# Patient Record
Sex: Female | Born: 1980 | Race: White | Hispanic: No | Marital: Single | Smoking: Current every day smoker
Health system: Southern US, Community
[De-identification: ages and names within clinical notes are randomized; demographics above are authoritative.]

## PROBLEM LIST (undated history)

## (undated) DIAGNOSIS — F1010 Alcohol abuse, uncomplicated: Secondary | ICD-10-CM

## (undated) DIAGNOSIS — Z789 Other specified health status: Secondary | ICD-10-CM

## (undated) DIAGNOSIS — I10 Essential (primary) hypertension: Secondary | ICD-10-CM

## (undated) HISTORY — PX: ANKLE SURGERY: SHX546

## (undated) HISTORY — DX: Alcohol abuse, uncomplicated: F10.10

---

## 2007-06-04 ENCOUNTER — Emergency Department: Admit: 2007-06-04 | Payer: Self-pay | Source: Emergency Department | Admitting: Emergency Medicine

## 2007-06-09 ENCOUNTER — Ambulatory Visit
Admit: 2007-06-09 | Disposition: A | Discharge status: Home / Self Care | Payer: Self-pay | Source: Ambulatory Visit | Admitting: Foot and Ankle Surgery

## 2007-06-09 LAB — HCG BLOOD QUANTITATIVE - AH CERNER: HCG: <0.1 m[IU]/mL (ref 0–9)

## 2007-06-11 ENCOUNTER — Ambulatory Visit
Admission: RE | Admit: 2007-06-11 | Disposition: A | Discharge status: Home / Self Care | Payer: Self-pay | Source: Ambulatory Visit | Admitting: Foot and Ankle Surgery

## 2010-03-19 ENCOUNTER — Ambulatory Visit: Payer: Self-pay

## 2010-03-24 LAB — LAB USE ONLY - HISTORICAL GYN CYTOLOGY/PAP SMEAR

## 2010-07-23 ENCOUNTER — Observation Stay
Admission: AD | Admit: 2010-07-23 | Disposition: A | Discharge status: Home / Self Care | Payer: Self-pay | Source: Ambulatory Visit | Admitting: Obstetrics & Gynecology

## 2010-07-23 LAB — COMPREHENSIVE METABOLIC PANEL
ALT: 13 U/L (ref 0–55)
AST (SGOT): 10 U/L (ref 5–34)
Albumin/Globulin Ratio: 0.9 (ref 0.9–2.2)
Albumin: 2.8 g/dL — ABNORMAL LOW (ref 3.5–5.0)
Alkaline Phosphatase: 86 U/L (ref 40–150)
Anion Gap: 10 (ref 5.0–15.0)
BUN: 4 mg/dL — ABNORMAL LOW (ref 7.0–19.0)
Bilirubin, Total: 0.2 mg/dL (ref 0.2–1.2)
CO2: 19 mEq/L — ABNORMAL LOW (ref 22–29)
Calcium: 8.8 mg/dL (ref 8.5–10.5)
Chloride: 106 mEq/L (ref 98–107)
Creatinine: 0.5 mg/dL — ABNORMAL LOW (ref 0.6–1.0)
Globulin: 3 g/dL (ref 2.0–3.6)
Glucose: 79 mg/dL (ref 70–100)
Potassium: 4 mEq/L (ref 3.5–5.1)
Protein, Total: 5.8 g/dL — ABNORMAL LOW (ref 6.0–8.3)
Sodium: 135 mEq/L — ABNORMAL LOW (ref 136–145)

## 2010-07-23 LAB — CBC AND DIFFERENTIAL
Baso(Absolute): 0.02 10*3/uL (ref 0.00–0.20)
Basophils: 0 % (ref 0–2)
Eosinophils Absolute: 0.12 10*3/uL (ref 0.00–0.70)
Eosinophils: 1 % (ref 0–5)
Hematocrit: 34 % — ABNORMAL LOW (ref 37.0–47.0)
Hgb: 11.2 g/dL — ABNORMAL LOW (ref 12.0–16.0)
Immature Granulocytes Absolute: 0.03 10*3/uL
Immature Granulocytes: 0 % (ref 0–1)
Lymphocytes Absolute: 2.18 10*3/uL (ref 0.50–4.40)
Lymphocytes: 19 % (ref 15–41)
MCH: 29.9 pg (ref 28.0–32.0)
MCHC: 32.9 g/dL (ref 32.0–36.0)
MCV: 90.9 fL (ref 80.0–100.0)
MPV: 10.5 fL (ref 9.4–12.3)
Monocytes Absolute: 0.56 10*3/uL (ref 0.00–1.20)
Monocytes: 5 % (ref 0–11)
Neutrophils Absolute: 8.75 10*3/uL
Neutrophils: 75 % (ref 52–75)
Platelets: 297 10*3/uL (ref 140–400)
RBC: 3.74 10*6/uL — ABNORMAL LOW (ref 4.20–5.40)
RDW: 14 % (ref 12–15)
WBC: 11.63 10*3/uL — ABNORMAL HIGH (ref 3.50–10.80)

## 2010-07-23 LAB — URINALYSIS, REFLEX TO MICROSCOPIC EXAM IF INDICATED
Bilirubin, UA: NEGATIVE
Blood, UA: NEGATIVE
Glucose, UA: NEGATIVE
Ketones UA: NEGATIVE
Nitrite, UA: NEGATIVE
Protein, UR: NEGATIVE
RBC, UA: <1 /HPF (ref 0–5)
Specific Gravity UA POCT: 1 (ref 1.001–1.035)
Squamous Epithelial Cells, Urine: <1 /HPF (ref 0–25)
Urine pH: 8 (ref 5.0–8.0)
Urobilinogen, UA: NORMAL mg/dL
WBC, UA: <1 /HPF (ref 0–5)

## 2010-07-23 LAB — PT AND APTT
PT INR: 0.9 (ref 0.9–1.1)
PT: 12 s — ABNORMAL LOW (ref 12.6–15.0)
PTT: 26 s (ref 23–37)

## 2010-07-23 LAB — FIBRINOGEN: Fibrinogen: 422 mg/dL (ref 189–458)

## 2010-07-23 LAB — GFR: EGFR: >60

## 2010-07-23 LAB — HEMOLYSIS INDEX: Hemolysis Index: 6 Index (ref 0–18)

## 2010-07-23 LAB — URIC ACID: Uric acid: 2.8 mg/dL (ref 2.6–6.0)

## 2010-07-23 LAB — FIBRIN SPLIT PRODUCTS: FDP: <10 ug/mL (ref <5)

## 2010-09-03 ENCOUNTER — Inpatient Hospital Stay
Admission: RE | Admit: 2010-09-03 | Disposition: A | Discharge status: Home / Self Care | Payer: Self-pay | Source: Ambulatory Visit | Admitting: Obstetrics

## 2010-09-03 LAB — CBC AND DIFFERENTIAL
Baso(Absolute): 0.03 10*3/uL (ref 0.00–0.20)
Basophils: 0 % (ref 0–2)
Eosinophils Absolute: 0.07 10*3/uL (ref 0.00–0.70)
Eosinophils: 0 % (ref 0–5)
Hematocrit: 35.8 % — ABNORMAL LOW (ref 37.0–47.0)
Hgb: 11.9 g/dL — ABNORMAL LOW (ref 12.0–16.0)
Immature Granulocytes Absolute: 0.08 10*3/uL — ABNORMAL HIGH
Immature Granulocytes: 1 % (ref 0–1)
Lymphocytes Absolute: 2.38 10*3/uL (ref 0.50–4.40)
Lymphocytes: 14 % — ABNORMAL LOW (ref 15–41)
MCH: 30.6 pg (ref 28.0–32.0)
MCHC: 33.2 g/dL (ref 32.0–36.0)
MCV: 92 fL (ref 80.0–100.0)
MPV: 11.4 fL (ref 9.4–12.3)
Monocytes Absolute: 0.99 10*3/uL (ref 0.00–1.20)
Monocytes: 6 % (ref 0–11)
Neutrophils Absolute: 13.04 10*3/uL
Neutrophils: 79 % — ABNORMAL HIGH (ref 52–75)
Platelets: 341 10*3/uL (ref 140–400)
RBC: 3.89 10*6/uL — ABNORMAL LOW (ref 4.20–5.40)
RDW: 14 % (ref 12–15)
WBC: 16.51 10*3/uL — ABNORMAL HIGH (ref 3.50–10.80)

## 2011-03-21 ENCOUNTER — Emergency Department
Admit: 2011-03-21 | Disposition: A | Discharge status: Home / Self Care | Payer: Self-pay | Source: Emergency Department | Admitting: Emergency Medicine

## 2011-10-06 NOTE — Op Note (Signed)
PATIENT:                       Linda Mclaughlin, Linda Mclaughlin      MEDICAL RECORD NUMBER:         [id number]      PATIENT LOCATION/SERVICE:       [id number]            DATE OF SURGERY:               06/11/2007      SURGEON:                       Gates Rigg, MD      ASSISTANT 1:            PREOPERATIVE DIAGNOSIS:  Right trimalleolar equivalent ankle fracture.            POSTOPERATIVE DIAGNOSIS:  Right trimalleolar equivalent ankle fracture.            PROCEDURE:  Open reduction internal fixation of the right trimalleolar      equivalent ankle fracture.            ANESTHESIA:  General.            COMPLICATIONS:  None.            INDICATIONS FOR PROCEDURE:  This patient is a pleasant 30 year old female      who suffered the above mentioned injury.  She had a deltoid ligament      rupture, nondisplaced posterior malleolar fracture and a displaced fibular      shaft fracture.  The risks and benefits of surgery were discussed at length      with the patient.  All of her questions were answered.  She wished to      proceed.            DESCRIPTION OF PROCEDURE:  The patient was brought back to the operating      room where general anesthesia was initiated.  Small bump was placed under      the right hip.  A well-padded thigh tourniquet was applied to the right      thigh.  The right lower extremity was prepped and draped in the usual      sterile fashion.  A longitudinal incision was made over the lateral aspect      of the distal fibula.  Blunt dissection was used to protect the branches of      the common peroneal nerve.  Limited subperiosteal dissection was performed.      There was a large posterior butterfly fragment.  Anatomic reduction was      obtained and an anterior-to-posterior lag screw was used to lag the      butterfly fragment.  Next, an eight-hole one-third tubular plate was placed      in typical fashion.  Combination of locking and nonlocking screws were      utilized.  Final FluoroScan imaging documented  anatomic reduction of the      tibiotalar joint.  The posterior malleolar fracture fell into position.      Due to its small size, fixation was not indicated.  Multiple stress views      and a cotton test did not reveal any evidence of talar instability.  For      this reason, a syndesmotic screw was not placed.  The wound was copiously      irrigated.  The deep tissues, subcutaneous tissues and skin were closed in      the typical fashion.  Bulky sterile dressings were applied.  Marcaine 0.5%      15 mL were infiltrated into the local soft tissues.  The patient was      awakened in the operating room and was transferred to the recovery room in      satisfactory condition.                                    Electronic Signing MD: Gates Rigg, MD                  [id number]      D:    06/11/2007      T:    06/12/2007      #:    [id number]      N:    [id number]            cc:   Gates Rigg, MD

## 2012-09-03 ENCOUNTER — Inpatient Hospital Stay: Payer: Self-pay | Admitting: Internal Medicine

## 2012-09-03 ENCOUNTER — Inpatient Hospital Stay
Admission: EM | Admit: 2012-09-03 | Discharge: 2012-09-06 | DRG: 086 | Disposition: A | Discharge status: Home / Self Care | Payer: Self-pay | Attending: Internal Medicine | Admitting: Internal Medicine

## 2012-09-03 DIAGNOSIS — F10931 Alcohol use, unspecified with withdrawal delirium: Secondary | ICD-10-CM | POA: Diagnosis not present

## 2012-09-03 DIAGNOSIS — S022XXA Fracture of nasal bones, initial encounter for closed fracture: Secondary | ICD-10-CM | POA: Diagnosis present

## 2012-09-03 DIAGNOSIS — S0990XA Unspecified injury of head, initial encounter: Secondary | ICD-10-CM | POA: Diagnosis present

## 2012-09-03 DIAGNOSIS — F10929 Alcohol use, unspecified with intoxication, unspecified: Secondary | ICD-10-CM | POA: Diagnosis present

## 2012-09-03 DIAGNOSIS — S066XAA Traumatic subarachnoid hemorrhage with loss of consciousness status unknown, initial encounter: Principal | ICD-10-CM | POA: Diagnosis present

## 2012-09-03 DIAGNOSIS — S066X9A Traumatic subarachnoid hemorrhage with loss of consciousness of unspecified duration, initial encounter: Secondary | ICD-10-CM

## 2012-09-03 DIAGNOSIS — F1020 Alcohol dependence, uncomplicated: Secondary | ICD-10-CM | POA: Diagnosis present

## 2012-09-03 DIAGNOSIS — H109 Unspecified conjunctivitis: Secondary | ICD-10-CM | POA: Diagnosis present

## 2012-09-03 DIAGNOSIS — F10229 Alcohol dependence with intoxication, unspecified: Secondary | ICD-10-CM | POA: Diagnosis present

## 2012-09-03 DIAGNOSIS — F919 Conduct disorder, unspecified: Secondary | ICD-10-CM | POA: Diagnosis present

## 2012-09-03 DIAGNOSIS — S02401A Maxillary fracture, unspecified, initial encounter for closed fracture: Secondary | ICD-10-CM | POA: Diagnosis present

## 2012-09-03 DIAGNOSIS — Z880 Allergy status to penicillin: Secondary | ICD-10-CM

## 2012-09-03 DIAGNOSIS — S0292XA Unspecified fracture of facial bones, initial encounter for closed fracture: Secondary | ICD-10-CM

## 2012-09-03 DIAGNOSIS — Y92009 Unspecified place in unspecified non-institutional (private) residence as the place of occurrence of the external cause: Secondary | ICD-10-CM

## 2012-09-03 DIAGNOSIS — K7010 Alcoholic hepatitis without ascites: Secondary | ICD-10-CM | POA: Diagnosis present

## 2012-09-03 DIAGNOSIS — S02400A Malar fracture unspecified, initial encounter for closed fracture: Secondary | ICD-10-CM | POA: Diagnosis present

## 2012-09-03 DIAGNOSIS — IMO0002 Reserved for concepts with insufficient information to code with codable children: Secondary | ICD-10-CM | POA: Diagnosis present

## 2012-09-03 DIAGNOSIS — D72829 Elevated white blood cell count, unspecified: Secondary | ICD-10-CM | POA: Diagnosis present

## 2012-09-03 DIAGNOSIS — S0230XA Fracture of orbital floor, unspecified side, initial encounter for closed fracture: Secondary | ICD-10-CM | POA: Diagnosis present

## 2012-09-03 DIAGNOSIS — F17200 Nicotine dependence, unspecified, uncomplicated: Secondary | ICD-10-CM | POA: Diagnosis present

## 2012-09-03 DIAGNOSIS — Z23 Encounter for immunization: Secondary | ICD-10-CM

## 2012-09-03 DIAGNOSIS — R030 Elevated blood-pressure reading, without diagnosis of hypertension: Secondary | ICD-10-CM | POA: Diagnosis present

## 2012-09-03 DIAGNOSIS — J329 Chronic sinusitis, unspecified: Secondary | ICD-10-CM | POA: Diagnosis present

## 2012-09-03 DIAGNOSIS — F10231 Alcohol dependence with withdrawal delirium: Secondary | ICD-10-CM | POA: Diagnosis not present

## 2012-09-03 DIAGNOSIS — Z818 Family history of other mental and behavioral disorders: Secondary | ICD-10-CM

## 2012-09-03 HISTORY — DX: Other specified health status: Z78.9

## 2012-09-03 MED ORDER — TETANUS-DIPHTHERIA TOXOIDS TD 5-2 LFU IM INJ
0.50 mL | INJECTION | Freq: Once | INTRAMUSCULAR | Status: AC
Start: 2012-09-04 — End: 2012-09-04
  Administered 2012-09-04: 0.5 mL via INTRAMUSCULAR
  Filled 2012-09-03: qty 0.5

## 2012-09-03 MED ORDER — HALOPERIDOL LACTATE 5 MG/ML IJ SOLN
5.00 mg | Freq: Once | INTRAMUSCULAR | Status: DC
Start: 2012-09-04 — End: 2012-09-04

## 2012-09-03 MED ORDER — LORAZEPAM 2 MG/ML IJ SOLN
2.00 mg | Freq: Once | INTRAMUSCULAR | Status: DC
Start: 2012-09-04 — End: 2012-09-04

## 2012-09-03 NOTE — ED Notes (Signed)
[employer] bedside.
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
## 2012-09-04 ENCOUNTER — Emergency Department: Payer: Self-pay

## 2012-09-04 DIAGNOSIS — S0990XA Unspecified injury of head, initial encounter: Secondary | ICD-10-CM

## 2012-09-04 DIAGNOSIS — F10929 Alcohol use, unspecified with intoxication, unspecified: Secondary | ICD-10-CM | POA: Diagnosis present

## 2012-09-04 DIAGNOSIS — S066X9A Traumatic subarachnoid hemorrhage with loss of consciousness of unspecified duration, initial encounter: Principal | ICD-10-CM

## 2012-09-04 LAB — CBC AND DIFFERENTIAL
Basophils Absolute Automated: 0.03 10*3/uL (ref 0.00–0.20)
Basophils Automated: 0 % (ref 0–2)
Eosinophils Absolute Automated: 0.1 10*3/uL (ref 0.00–0.70)
Eosinophils Automated: 1 % (ref 0–5)
Hematocrit: 46.9 % (ref 37.0–47.0)
Hgb: 15.8 g/dL (ref 12.0–16.0)
Immature Granulocytes Absolute: 0.04 10*3/uL
Immature Granulocytes: 0 % (ref 0–1)
Lymphocytes Absolute Automated: 1.87 10*3/uL (ref 0.50–4.40)
Lymphocytes Automated: 15 % (ref 15–41)
MCH: 34.6 pg — ABNORMAL HIGH (ref 28.0–32.0)
MCHC: 33.7 g/dL (ref 32.0–36.0)
MCV: 102.9 fL — ABNORMAL HIGH (ref 80.0–100.0)
MPV: 10.3 fL (ref 9.4–12.3)
Monocytes Absolute Automated: 0.52 10*3/uL (ref 0.00–1.20)
Monocytes: 4 % (ref 0–11)
Neutrophils Absolute: 9.61 10*3/uL — ABNORMAL HIGH (ref 1.80–8.10)
Neutrophils: 79 % — ABNORMAL HIGH (ref 52–75)
Nucleated RBC: 0 /100 WBC (ref 0–1)
Platelets: 264 10*3/uL (ref 140–400)
RBC: 4.56 10*6/uL (ref 4.20–5.40)
RDW: 13 % (ref 12–15)
WBC: 12.17 10*3/uL — ABNORMAL HIGH (ref 3.50–10.80)

## 2012-09-04 LAB — GFR: EGFR: >60

## 2012-09-04 LAB — BASIC METABOLIC PANEL
Anion Gap: 13 (ref 5.0–15.0)
BUN: 7 mg/dL (ref 7.0–19.0)
CO2: 22 mEq/L (ref 22–29)
Calcium: 9.6 mg/dL (ref 8.5–10.5)
Chloride: 107 mEq/L (ref 98–107)
Creatinine: 0.7 mg/dL (ref 0.6–1.0)
Glucose: 97 mg/dL (ref 70–100)
Potassium: 3.9 mEq/L (ref 3.5–5.1)
Sodium: 142 mEq/L (ref 136–145)

## 2012-09-04 LAB — HEMOLYSIS INDEX: Hemolysis Index: 12 Index (ref 0–18)

## 2012-09-04 LAB — ETHANOL: Alcohol: 359 mg/dL — ABNORMAL HIGH

## 2012-09-04 MED ORDER — HALOPERIDOL LACTATE 5 MG/ML IJ SOLN
INTRAMUSCULAR | Status: AC
Start: 2012-09-04 — End: 2012-09-04
  Administered 2012-09-04: 5 mg via INTRAMUSCULAR
  Filled 2012-09-04: qty 1

## 2012-09-04 MED ORDER — ACETAMINOPHEN 325 MG PO TABS
325.0000 mg | ORAL_TABLET | ORAL | Status: DC | PRN
Start: 2012-09-04 — End: 2012-09-06

## 2012-09-04 MED ORDER — LORAZEPAM 2 MG/ML IJ SOLN
1.00 mg | Freq: Three times a day (TID) | INTRAMUSCULAR | Status: DC | PRN
Start: 2012-09-04 — End: 2012-09-04

## 2012-09-04 MED ORDER — ACETAMINOPHEN 325 MG PO TABS
650.0000 mg | ORAL_TABLET | Freq: Four times a day (QID) | ORAL | Status: DC | PRN
Start: 2012-09-04 — End: 2012-09-06

## 2012-09-04 MED ORDER — KCL IN DEXTROSE-NACL 20-5-0.9 MEQ/L-%-% IV SOLN
INTRAVENOUS | Status: DC
Start: 2012-09-04 — End: 2012-09-06
  Filled 2012-09-04 (×2): qty 1000

## 2012-09-04 MED ORDER — PANTOPRAZOLE SODIUM 40 MG IV SOLR
40.00 mg | Freq: Every day | INTRAVENOUS | Status: DC
Start: 2012-09-04 — End: 2012-09-06
  Administered 2012-09-04 – 2012-09-06 (×3): 40 mg via INTRAVENOUS
  Filled 2012-09-04 (×3): qty 40

## 2012-09-04 MED ORDER — ONDANSETRON HCL 4 MG/2ML IJ SOLN
4.00 mg | INTRAMUSCULAR | Status: DC | PRN
Start: 2012-09-04 — End: 2012-09-06

## 2012-09-04 MED ORDER — SODIUM CHLORIDE 0.9 % IV BOLUS
1000.00 mL | Freq: Once | INTRAVENOUS | Status: AC
Start: 2012-09-04 — End: 2012-09-04
  Administered 2012-09-04: 1000 mL via INTRAVENOUS

## 2012-09-04 MED ORDER — LORAZEPAM 2 MG/ML IJ SOLN
1.00 mg | INTRAMUSCULAR | Status: DC | PRN
Start: 2012-09-04 — End: 2012-09-06

## 2012-09-04 MED ORDER — LORAZEPAM 2 MG/ML IJ SOLN
2.00 mg | Freq: Once | INTRAMUSCULAR | Status: AC
Start: 2012-09-04 — End: 2012-09-04

## 2012-09-04 MED ORDER — LORAZEPAM 2 MG/ML IJ SOLN
INTRAMUSCULAR | Status: AC
Start: 2012-09-04 — End: 2012-09-04
  Administered 2012-09-04: 2 mg via INTRAMUSCULAR
  Filled 2012-09-04: qty 1

## 2012-09-04 MED ORDER — HYDROMORPHONE HCL PF 1 MG/ML IJ SOLN
1.00 mg | INTRAMUSCULAR | Status: DC | PRN
Start: 2012-09-04 — End: 2012-09-06

## 2012-09-04 MED ORDER — HALOPERIDOL LACTATE 5 MG/ML IJ SOLN
5.00 mg | Freq: Once | INTRAMUSCULAR | Status: AC
Start: 2012-09-04 — End: 2012-09-04

## 2012-09-04 MED ORDER — LORAZEPAM 1 MG PO TABS
1.00 mg | ORAL_TABLET | ORAL | Status: DC | PRN
Start: 2012-09-04 — End: 2012-09-06

## 2012-09-04 MED ORDER — CLINDAMYCIN PHOSPHATE IN D5W 900 MG/50ML IV SOLN
900.00 mg | Freq: Three times a day (TID) | INTRAVENOUS | Status: DC
Start: 2012-09-04 — End: 2012-09-06
  Administered 2012-09-04 – 2012-09-06 (×6): 900 mg via INTRAVENOUS
  Filled 2012-09-04 (×9): qty 50

## 2012-09-04 MED ORDER — DEXTROSE-SODIUM CHLORIDE 5-0.9 % IV SOLN
Freq: Every day | INTRAVENOUS | Status: DC
Start: 2012-09-04 — End: 2012-09-06
  Filled 2012-09-04 (×4): qty 1000

## 2012-09-04 MED ORDER — DEXTROSE-SODIUM CHLORIDE 5-0.45 % IV SOLN
100.00 mL/h | INTRAVENOUS | Status: DC
Start: 2012-09-04 — End: 2012-09-04
  Administered 2012-09-04: 100 mL/h via INTRAVENOUS

## 2012-09-04 NOTE — ED Provider Notes (Signed)
The pt was seen by the midlevel provider.  I discussed the treatment and the plan of care with the midlevel provider.  I agree with the treatment plan and assessment.    I evaluated the pt.  Pt ambulating without difficulty.  Pt combative and argumentative.  Moving all extremities.    Jethro Bastos, MD  09/04/12 [phone]

## 2012-09-04 NOTE — ED Notes (Signed)
Patient is resting comfortably. 

## 2012-09-04 NOTE — ED Notes (Signed)
Pt. Was un handcuffed sitting in bed, needed to use restroom and pulled IV tubing out of arm, proceeded to walk to bathroom. Became very agitated and uncooperative, raising her voice and shouting expletives to ER staff and [employer]. Pt. Was brought back to room, handcuffed to bed and given sedative medication. Pt. Still remained agitated and shouting expletives to ER staff and [employer]. Pt. Now sleeping in bed with handcuffs on.

## 2012-09-04 NOTE — ED Notes (Signed)
inpatient admission order entered, clinicals completed, defer to unit cm/sw for ur and dcp needs.
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
## 2012-09-04 NOTE — ED Notes (Signed)
Received report form Edward Jolly, Charity fundraiser
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
## 2012-09-05 LAB — COMPREHENSIVE METABOLIC PANEL
ALT: 25 U/L (ref 0–55)
AST (SGOT): 31 U/L (ref 5–34)
Albumin/Globulin Ratio: 1.1 (ref 0.9–2.2)
Albumin: 3.4 g/dL — ABNORMAL LOW (ref 3.5–5.0)
Alkaline Phosphatase: 73 U/L (ref 40–150)
Anion Gap: 11 (ref 5.0–15.0)
BUN: 5 mg/dL — ABNORMAL LOW (ref 7.0–19.0)
Bilirubin, Total: 1.3 mg/dL — ABNORMAL HIGH (ref 0.2–1.2)
CO2: 22 mEq/L (ref 22–29)
Calcium: 9 mg/dL (ref 8.5–10.5)
Chloride: 102 mEq/L (ref 98–107)
Creatinine: 0.7 mg/dL (ref 0.6–1.0)
Globulin: 3.2 g/dL (ref 2.0–3.6)
Glucose: 82 mg/dL (ref 70–100)
Potassium: 3.8 mEq/L (ref 3.5–5.1)
Protein, Total: 6.6 g/dL (ref 6.0–8.3)
Sodium: 135 mEq/L — ABNORMAL LOW (ref 136–145)

## 2012-09-05 LAB — GFR: EGFR: >60

## 2012-09-05 LAB — CBC
Hematocrit: 41.8 % (ref 37.0–47.0)
Hgb: 13.8 g/dL (ref 12.0–16.0)
MCH: 34.3 pg — ABNORMAL HIGH (ref 28.0–32.0)
MCHC: 33 g/dL (ref 32.0–36.0)
MCV: 104 fL — ABNORMAL HIGH (ref 80.0–100.0)
MPV: 10.2 fL (ref 9.4–12.3)
Nucleated RBC: 0 /100 WBC (ref 0–1)
Platelets: 204 10*3/uL (ref 140–400)
RBC: 4.02 10*6/uL — ABNORMAL LOW (ref 4.20–5.40)
RDW: 13 % (ref 12–15)
WBC: 7.72 10*3/uL (ref 3.50–10.80)

## 2012-09-05 LAB — HEMOLYSIS INDEX: Hemolysis Index: 4 Index (ref 0–18)

## 2012-09-05 MED ORDER — SULFACETAMIDE SODIUM 10 % OP SOLN
2.00 [drp] | Freq: Four times a day (QID) | OPHTHALMIC | Status: DC
Start: 2012-09-05 — End: 2012-09-06
  Administered 2012-09-05 – 2012-09-06 (×4): 2 [drp] via OPHTHALMIC
  Filled 2012-09-05: qty 15

## 2012-09-05 MED ORDER — CLONIDINE HCL 0.1 MG PO TABS
0.10 mg | ORAL_TABLET | Freq: Four times a day (QID) | ORAL | Status: DC | PRN
Start: 2012-09-05 — End: 2012-09-06
  Administered 2012-09-05: 0.1 mg via ORAL
  Filled 2012-09-05: qty 1

## 2012-09-05 MED ORDER — AMLODIPINE BESYLATE 5 MG PO TABS
10.00 mg | ORAL_TABLET | Freq: Every day | ORAL | Status: DC
Start: 2012-09-05 — End: 2012-09-06
  Administered 2012-09-05 – 2012-09-06 (×2): 10 mg via ORAL
  Filled 2012-09-05 (×2): qty 2

## 2012-09-05 MED ORDER — CHLORDIAZEPOXIDE HCL 25 MG PO CAPS
25.00 mg | ORAL_CAPSULE | Freq: Four times a day (QID) | ORAL | Status: DC
Start: 2012-09-05 — End: 2012-09-06
  Administered 2012-09-05 – 2012-09-06 (×3): 25 mg via ORAL
  Filled 2012-09-05 (×5): qty 1

## 2012-09-05 MED ORDER — ATENOLOL 25 MG PO TABS
50.00 mg | ORAL_TABLET | Freq: Every day | ORAL | Status: DC
Start: 2012-09-05 — End: 2012-09-06
  Administered 2012-09-05 – 2012-09-06 (×2): 50 mg via ORAL
  Filled 2012-09-05 (×2): qty 2
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
## 2012-09-05 NOTE — Plan of Care (Signed)
PINC:    Pt is alert and oriented X 3. Plan of care was discussed with the pt: Discharge was delayed because BP was too high. Educated pt on BP meds. Possible [place] tomorrow. Pt verbalized understanding.

## 2012-09-05 NOTE — Progress Notes (Signed)
Pt remains alert and oriented x3. BP elevated all day. MD aware, clonidine prn given together with scheduled meds. Pt denies any pain or discomfort on this shift. Pt on IVF, IV abx, neuro checks and ciwa

## 2012-09-05 NOTE — Progress Notes (Signed)
On exam the patient has no pronator drift and is neurologically intact.  Head CT shows traumatic SAH.  No surgery indicated.  Agree with above note.    Paulette Blanch, MD, PhD
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
## 2012-09-05 NOTE — Progress Notes (Signed)
PROGRESS NOTE    Date Time: 09/05/2012 9:03 AM  Patient Name: Linda Mclaughlin, Linda Mclaughlin      Subjective:   Feels better this am  Mild tremors    Medications:     Current Facility-Administered Medications   Medication Status Dose Route Frequency   . chlordiazePOXIDE Active  25 mg Oral Q6H   . clindamycin Active  900 mg Intravenous Q8H SCH   . IV fluids with MVI, thiamine, folic acid Active   Intravenous Daily   . pantoprazole Active  40 mg Intravenous Daily   . sulfacetamide Active  2 drop Both Eyes Q6H   . tetanus & diphtheria toxoids (adult) Completed  0.5 mL Intramuscular Once          . dextrose 5 % and 0.9 % NaCl with KCl 20 mEq Active 100 mL/hr at 09/04/12 2358   . DISCONTD: dextrose 5 % and 0.45% NaCl Discontinued 100 mL/hr (09/04/12 0631)     acetaminophen Active, acetaminophen Active, cloNIDine Active, HYDROmorphone Active, LORazepam Active, LORazepam Active, ondansetron Active, DISCONTD: LORazepam Discontinued    Review of Systems:   Respiratory ROS: no cough, shortness of breath, or wheezing  Cardiovascular ROS: no chest pain or dyspnea on exertion  No  Blurred vision currently.  Mild headaches.    Physical Exam:     Filed Vitals:    09/05/12 0744   BP: 165/97   Pulse: 60   Temp: 97.2 F (36.2 C)   Resp: 17   SpO2: 99%       Intake and Output Summary (Last 24 hours) at Date Time  No intake or output data in the 24 hours ending 09/05/12 0903    General: awake, alert, oriented x 3; no acute distress.  Heent: left  Periorbital  Ecchymosis  And dried blood  Around  Nostrils.pupils equal and reactive . conjunctivitis  Cardiovascular: regular rate and rhythm, no murmurs, rubs or gallops  Lungs: clear to auscultation bilaterally, without wheezing, rhonchi, or rales  Abdomen: soft, non-tender, non-distended; no palpable masses, no hepatosplenomegaly, normoactive bowel sounds, no rebound or guarding  Extremities: no clubbing, cyanosis, or edema  Areas of  Abrasions on left  Arm and forearm.  CNS  Non focal exam   Mild  tremors  Noted.      Labs:     Results     Procedure Component Value Units Date/Time    CBC without differential [[id number]]  (Abnormal) Collected:09/05/12 0408    Specimen Information:Blood Updated:09/05/12 0511     WBC 7.72 x10 3/uL      RBC 4.02 (L) x10 6/uL      Hgb 13.8 g/dL      Hematocrit 16.1 %      MCV 104.0 (H) fL      MCH 34.3 (H) pg      MCHC 33.0 g/dL      RDW 13 %      Platelets 204 x10 3/uL      MPV 10.2 fL      Nucleated RBC 0 /100 WBC     Comprehensive metabolic panel [[id number]]  (Abnormal) Collected:09/05/12 0408    Specimen Information:Blood Updated:09/05/12 0455     Glucose 82 mg/dL      BUN 5.0 (L) mg/dL      Creatinine 0.7 mg/dL      Sodium 096 (L) mEq/L      Potassium 3.8 mEq/L      Chloride 102 mEq/L      CO2 22 mEq/L  Calcium 9.0 mg/dL      Protein, Total 6.6 g/dL      Albumin 3.4 (L) g/dL      AST (SGOT) 31 U/L      ALT 25 U/L      Alkaline Phosphatase 73 U/L      Bilirubin, Total 1.3 (H) mg/dL      Globulin 3.2 g/dL      Albumin/Globulin Ratio 1.1      Anion Gap 11.0     HEMOLYZED INDEX [[id number]] Collected:09/05/12 0408     Hemolyzed Index 4 Index Updated:09/05/12 0455    GFR [[id number]] Collected:09/05/12 0408     EGFR >60.0   Updated:09/05/12 0455          Rads:     Radiology Results (24 Hour)     ** No Results found for the last 24 hours. **          Assessment:     Patient Active Problem List   Diagnosis   . Alcohol intoxication   . Head injury    leukocytosis  Secondary  To trauma/cellulitis.  Traumatic   SAH.   Facial  Bone  Fractures.  Conjuntivitis.  Elevated BP secondary to  Alcohol withdrawals?   Alcoholic hepatitis.    Plan:   Empiric  IV antibiotics  IVF with thiamine  Topical  Antibiotics for  Conjunctivitis.  Add  Librium  For  DT prophylaxis  Analgesics.  Prn clonidine to counteract  Sympathetic discharge.  CIWA  Protocol to  Prevent  Delirium tremens.  Plan  Discussed  with  RN  Continue rest of treatment.  Possible discharge in am if blood pressure improved.  Signed  by: Gwyndolyn Kaufman, MD

## 2012-09-05 NOTE — Plan of Care (Signed)
Pt slept well,denies any pain,neuro checks wnl.IV fluids infusing,am labs done.Plan: Continue monitor vital signs,neuro checks and CIWA.

## 2012-09-06 LAB — CBC
Hematocrit: 39.3 % (ref 37.0–47.0)
Hgb: 13.2 g/dL (ref 12.0–16.0)
MCH: 34.5 pg — ABNORMAL HIGH (ref 28.0–32.0)
MCHC: 33.6 g/dL (ref 32.0–36.0)
MCV: 102.6 fL — ABNORMAL HIGH (ref 80.0–100.0)
MPV: 10.6 fL (ref 9.4–12.3)
Nucleated RBC: 0 /100 WBC (ref 0–1)
Platelets: 175 10*3/uL (ref 140–400)
RBC: 3.83 10*6/uL — ABNORMAL LOW (ref 4.20–5.40)
RDW: 13 % (ref 12–15)
WBC: 7.23 10*3/uL (ref 3.50–10.80)

## 2012-09-06 LAB — COMPREHENSIVE METABOLIC PANEL
ALT: 21 U/L (ref 0–55)
AST (SGOT): 27 U/L (ref 5–34)
Albumin/Globulin Ratio: 1 (ref 0.9–2.2)
Albumin: 3.2 g/dL — ABNORMAL LOW (ref 3.5–5.0)
Alkaline Phosphatase: 74 U/L (ref 40–150)
Anion Gap: 9 (ref 5.0–15.0)
BUN: 6 mg/dL — ABNORMAL LOW (ref 7.0–19.0)
Bilirubin, Total: 0.8 mg/dL (ref 0.2–1.2)
CO2: 23 mEq/L (ref 22–29)
Calcium: 9.3 mg/dL (ref 8.5–10.5)
Chloride: 103 mEq/L (ref 98–107)
Creatinine: 0.7 mg/dL (ref 0.6–1.0)
Globulin: 3.1 g/dL (ref 2.0–3.6)
Glucose: 100 mg/dL (ref 70–100)
Potassium: 3.7 mEq/L (ref 3.5–5.1)
Protein, Total: 6.3 g/dL (ref 6.0–8.3)
Sodium: 135 mEq/L — ABNORMAL LOW (ref 136–145)

## 2012-09-06 LAB — HEMOLYSIS INDEX: Hemolysis Index: 6 Index (ref 0–18)

## 2012-09-06 LAB — GFR: EGFR: >60

## 2012-09-06 MED ORDER — CHLORDIAZEPOXIDE HCL 10 MG PO CAPS
10.00 mg | ORAL_CAPSULE | Freq: Four times a day (QID) | ORAL | Status: DC
Start: 2012-09-06 — End: 2012-09-06

## 2012-09-06 MED ORDER — SULFACETAMIDE SODIUM 10 % OP SOLN
2.00 [drp] | Freq: Four times a day (QID) | OPHTHALMIC | Status: AC
Start: 2012-09-06 — End: 2012-09-16

## 2012-09-06 MED ORDER — CLINDAMYCIN HCL 300 MG PO CAPS
300.00 mg | ORAL_CAPSULE | Freq: Three times a day (TID) | ORAL | Status: AC
Start: 2012-09-06 — End: 2012-09-16

## 2012-09-06 MED ORDER — ATENOLOL 50 MG PO TABS
50.00 mg | ORAL_TABLET | Freq: Every day | ORAL | Status: AC
Start: 2012-09-06 — End: 2013-09-06

## 2012-09-06 MED ORDER — CHLORDIAZEPOXIDE HCL 10 MG PO CAPS
10.00 mg | ORAL_CAPSULE | Freq: Three times a day (TID) | ORAL | Status: AC | PRN
Start: 2012-09-06 — End: 2012-09-18

## 2012-09-06 MED ORDER — ACETAMINOPHEN 325 MG PO TABS
650.00 mg | ORAL_TABLET | Freq: Four times a day (QID) | ORAL | Status: AC | PRN
Start: 2012-09-06 — End: 2012-09-16

## 2012-09-06 NOTE — Plan of Care (Signed)
Pt to be discharged home. Alert and oriented  X3. Vital signs stable.  Pt ambulated in the hallway, blood pressure remained stable.
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
## 2012-09-06 NOTE — Discharge Instructions (Addendum)
Alcohol Abuse  Alcoholic drinks are harmful when you have too many of them. Drinking that disrupts your life is called alcohol abuse. Alcohol abuse can hurt your relationships with others. You may lose friends, a spouse, or even your job. You may be abusing alcohol if any of the following are true for you:   Duties at home or with child care suffer because of drinking.   Duties at work or in school suffer because of drinking.   You have missed work or school because of drinking.   You use alcohol while driving or operating machinery.   You have legal problems such as arrests due to drinking.   You keep drinking even though it causes serious problems in your life.  Alcohol abuse can cause health problems. Too much alcohol can cause disease of the liver and pancreas. It can damage your brain and your heart. It raises your risk of cancer. It can lead to sexual problems and make it hard to conceive children. Alcohol abuse in pregnancy can hurt the baby. Alcohol affects how you think and respond. You are more likely to die in an accident or fire if you have been drinking.  Home Care   Admit you have a problem with alcohol.   Ask for help from your healthcare provider as well as trusted family members or close friends.   Get help from people trained in dealing with alcohol abuse. This may be individual counseling or group therapy, or it may be a supervised alcohol treatment program.   Join a self-help group for alcohol abuse such as Alcoholics Anonymous (AA).   Avoid people who abuse alcohol or tempt you to drink.  Follow Up  as advised by the doctor or our staff. Contact these groups to get help:   Alcoholics Anonymous (AA): Go to SalaryStart.tn or check the phone book for meetings near you.   National Alcohol and Substance Abuse Information Center [facility]): (782)291-1677 www.addictioncareoptions.com   [employer] on Alcoholism and Drug Dependence (NCADD): 800-NCA-CALL (602)007-8034) www.ncadd.org   Al-Anon:  888-4AL-ANON (478-2956) www.al-anon.org  Get Prompt Medical Attention  if you have:   Confusion   Hallucinations (seeing, hearing, or feeling things that aren't there)   Extreme drowsiness or inability to awaken   Increasing upper abdominal pain   Repeated vomiting, vomiting blood, or black or tarry stools   Severe shakiness or fast heart rate   Seizure (convulsion)   [address] [place], [place] [address]. All rights reserved. This information is not intended as a substitute for professional medical care. Always follow your healthcare professional's instructions.      Alcohol Overdose    You have overdosed on alcohol. This means you drank a large amount of alcohol in a short period of time.An alcohol overdose is a serious condition. It can cause coma and even death! It puts you at risk for vomiting and "aspiration" (when the vomit is breathed into your lungs!). Aspiration causes a severe pneumonia that can be fatal.  An alcohol overdose can be a result of not understanding the powerful effects of alcohol. But it can also be a sign of depression or excessive anger at yourself or another person. If you feel that there are emotional reasons for this overdose, we advise that you have an evaluation by a psychiatrist, counselor or therapist.  Home Care:   Do not drink any more alcohol.   DO NOT DRIVE until all effects of the alcohol have worn off.  Get lots of rest over the next few days and drink lots of water and other non-alcoholic liquids. Try to eat regular meals.   If you have been drinking heavily on a daily basis, you may go through ALCOHOL WITHDRAWAL, also called the "shakes" or "DTs." The usual symptoms last 3-4 days and may include nausea, sweating, sleeplessness, nervousness, shakiness or seizure. During this time, it is best that you stay with family or friends who can help and support you. You can also admit yourself to a residential detox program.   Heavy  regular drinking combined with poor nutrition can lead to a thiamine deficiency and cause permanent brain damage.If you are unable to stop drinking, it is important that you take daily vitamins.  Follow Up:  If alcohol is causing a problem in your life, the following organizations are available to help:   Alcoholics Anonymous offers support through a self-help fellowship.There are no dues or fees. See the Yellow Pages and call for time and place of meetings. SalaryStart.tn   Al-Anon offers support to families of alcohol users.800-356-9996www.al-anon.[employer] on Alcoholism and Drug Dependencewww.ncadd.567-667-2861   Residential alcohol detox programs are available. Check the Yellow Pages under Drug Abuse & Treatment Centers.  [NOTE: If you had an X-ray or EKG (cardiogram), it will be reviewed by a specialist. You will be notified of any new findings that may affect your care.]  Return Promptly  or contact your doctor if any of the following occur:   Severe shakiness or seizure (convulsion)   Fever over 100.4 F (38C)   Confusion or hallucinations (seeing, hearing or feeling things that aren't there)   Increasing upper abdominal pain   Repeated vomiting or vomiting blood   [address], [place], [place] [address]. All rights reserved. This information is not intended as a substitute for professional medical care. Always follow your healthcare professional's instructions.      Alcohol Intoxication  Alcohol intoxication occurs when you drink alcohol faster than your liver can remove it from your system. Alcohol intoxication affects your judgment and coordination. Very high blood alcohol levels can cause coma, very slow breathing and even death.  If you drink alcohol every day, this may gradually cause permanent damage to your liver, brain, heart, pancreas and other organs. Alcohol use during pregnancy may cause permanent damage to the growing baby.  Home  Care:   Do not drink any more alcohol.   DO NOT DRIVE until all effects of the alcohol have worn off.   Get lots of rest over the next few days. Drink plenty of water and other non-alcoholic liquids. Try to eat regular meals.   If you have been drinking heavily on a daily basis, you may go through alcohol withdrawl. This is also called the "shakes" or "DTs." The usual symptoms last 3 to 4 days and may include nervousness, shakiness, nausea, sweating or sleeplessness. During this time, it is best that you stay with family or friends who can help and support you. You can also admit yourself to a residential detox program. If your symptoms are severe, contact your doctor for medicines to help.  Follow Up:  If alcohol is causing a problem in your life, these and other organizations can help you:  Alcoholics Anonymous offers support through a self-help fellowship. There are no dues or fees. See the Yellow Pages and call for time and place of meetings. SalaryStart.tn  Al-Anon offers support to families of alcohol users.  515-094-5277 www.al-anon.[employer] On Alcoholism And Drug Dependence 2027155753 www.ncadd.org  There are also inpatient or residential alcohol detox programs. Check the Internet or phonebook Yellow Pages under "Drug Abuse & Treatment Centers."  Get Prompt Medical Attention  if any of the following occur:   Severe shakiness or seizure (convulsion)   Fever over 100.4 F (38.0 C)   Confusion or hallucinations (seeing, hearing or feeling things that are not there)   Increasing upper abdominal pain   Repeated vomiting or vomiting blood   [address] [place], [place] [address]. All rights reserved. This information is not intended as a substitute for professional medical care. Always follow your healthcare professional's instructions.      Facial Fracture  A facial fracture means you have one or more broken bones in your face. These may be in your jaw,  nose, cheeks, or the sockets around your eyes. Car accidents are the most common cause of facial fractures. Fights, falls, and sports injuries can also injure facial bones.    When to Go to the Emergency Room (ER)  A broken bone in your face is cause for concern. That's because your airways may become clogged with bone fragments, blood clots, or swollen tissue. Go to the ER or call 911 right away if you have any of these symptoms:   Pain and swelling in your face   Trouble swallowing or breathing   An upper and lower jaw that don't meet properly, or pain when you move your jaw   A displaced jaw or nose   An open wound where you can see the bone   Blood or clear fluid from your nose   Blurred vision, double vision, or problems moving your eyeball  What to Expect in the ER  You will likely be given medication for pain. A doctor will ask about your injury and examine your head and face. X-rays or other imaging tests may be done. Treatment of facial fractures occurs in two stages:   Reduction: The broken bones are put back into place. This is often done after the swelling subsides, but severe fractures may be repaired right away.   Fixation: The broken bones are held together so they heal correctly. A fractured jaw is likely to be wired shut for a time. A broken nose is treated with a splint or soft packing. Surgery may be done to repair and secure broken bones around your eyes.   [address] [place], [place] [address]. All rights reserved. This information is not intended as a substitute for professional medical care. Always follow your healthcare professional's instructions.          Stages of Addiction: Social Use  Not everyone who takes a drink or tries a drug has a substance abuse problem. But for some people, what starts as social use can lead to problem use (abuse) and then addiction. Some people can have a drink or use another substance and then stop. They don't think  about drinking or using again for a while. For some this will never lead to heavier use. For others, it may.    You Can Take It or Leave It   You have a drink at a party or a glass of wine with dinner.   You tried a street drug once or twice, but never used again.   You take medications only when needed and only as directed.   You don't think about  using or plan the next time you'll use.  Using is Still a Choice  A social user can choose to say "yes" or "no" at any time. This person is in control of his or her substance use. Think about these statements:   I can put down a drink without finishing it.   I can enjoy myself at a social event without using.   I can go without using or thinking about using.  If this describes you, you're most likely a social user. But ask yourself, can you really take it or leave it? You might try for a few weeks or even a month. If you find it hard or you just can't do it, maybe you need to think about your use again.   [address], [place], [place] [address]. All rights reserved. This information is not intended as a substitute for professional medical care. Always follow your healthcare professional's instructions.       Patient Education   Atenolol Oral tablet    Atenolol Solution for injection   Atenolol Oral tablet    What is this medicine?  ATENOLOL (a TEN oh lole) is a beta-blocker. Beta-blockers reduce the workload on the heart and help it to beat more regularly. This medicine is used to treat high blood pressure and to prevent chest pain. It is also used to protect the heart during a heart attack and to prevent an additional heart attack from occurring.  This medicine may be used for other purposes; ask your health care provider or pharmacist if you have questions.    What should I tell my health care provider before I take this medicine?  They need to know if you have any of these conditions:   diabetes   heart or vessel disease like  slow heart rate, worsening heart failure, heart block, sick sinus syndrome or Raynaud's disease   kidney disease   lung or breathing disease, like asthma or emphysema   pheochromocytoma   thyroid disease   an unusual or allergic reaction to atenolol, other beta-blockers, medicines, foods, dyes, or preservatives   pregnant or trying to get pregnant   breast-feeding     How should I use this medicine?  Take this medicine by mouth with a drink of water. Follow the directions on the prescription label. This medicine may be taken with or without food. Take your medicine at regular intervals. Do not take more medicine than directed. Do not stop taking this medicine suddenly. This could lead to serious heart-related effects.  Talk to your pediatrician regarding the use of this medicine in children. Special care may be needed.  Overdosage: If you think you have taken too much of this medicine contact a poison control center or emergency room at once.  NOTE: This medicine is only for you. Do not share this medicine with others.    What if I miss a dose?  If you miss a dose, take it as soon as you can. If it is almost time for your next dose, take only that dose. Do not take double or extra doses.    What may interact with this medicine?  Do not take this medicine with any of the following medications:   sotalol  This medicine may also interact with the following medications:   clonidine   digoxin   diuretics   dobutamine   epinephrine   isoproterenol   medicine for blood pressure like amlodipine, diltiazem, verapamil  NSAIDs, medicines for pain and inflammation, like ibuprofen or naproxen   reserpine  This list may not describe all possible interactions. Give your health care provider a list of all the medicines, herbs, non-prescription drugs, or dietary supplements you use. Also tell them if you smoke, drink alcohol, or use illegal drugs. Some items may interact with your medicine.    What should I watch  for while using this medicine?  Visit your doctor or health care professional for regular check ups. Check your blood pressure and pulse rate regularly. Ask your health care professional what your blood pressure and pulse rate should be, and when you should contact him or her.  You may get drowsy or dizzy. Do not drive, use machinery, or do anything that needs mental alertness until you know how this medicine affects you. Do not stand or sit up quickly. Alcohol may interfere with the effect of this medicine. Avoid alcoholic drinks.  This medicine can affect blood sugar levels. If you have diabetes, check with your doctor or health care professional before you change your diet or the dose of your diabetic medicine.  Do not treat yourself for coughs, colds, or pain while you are taking this medicine without asking your doctor or health care professional for advice. Some ingredients may increase your blood pressure.    What side effects may I notice from receiving this medicine?  Side effects that you should report to your doctor or health care professional as soon as possible:   allergic reactions like skin rash, itching or hives, swelling of the face, lips, or tongue   breathing problems   changes in vision   chest pain   cold, tingling, or numb hands or feet   depression   fast, irregular heartbeat   feeling faint or lightheaded, falls   fever with sore throat   rapid weight gain   swollen ankles, legs  Side effects that usually do not require medical attention (report to your doctor or health care professional if they continue or are bothersome):   anxiety, nervous   diarrhea   dry skin   change in sex drive or performance   headache   nightmares or trouble sleeping   short term memory loss   stomach upset   unusually tired  This list may not describe all possible side effects. Call your doctor for medical advice about side effects. You may report side effects to FDA at 1-800-FDA-1088.    Where  should I keep my medicine?  Keep out of the reach of children.  Store at room temperature between 20 and 25 degrees C (68 and 77 degrees F). Close tightly and protect from light. Throw away any unused medicine after the expiration date.  NOTE:This sheet is a summary. It may not cover all possible information. If you have questions about this medicine, talk to your doctor, pharmacist, or health care provider. Copyright 2013 [employer]    Hypertension    You have been diagnosed with elevated blood pressure.    The medical term for high blood pressure is hypertension. Many people feel anxious or uncomfortable about being at the hospital. If you feel anxious today, this could make your blood pressure appear high, even if your blood pressure is usually normal. Check your blood pressure several more times when you are not feeling stress. Keep a record of these readings and give this information to your regular doctor. He or she will decide whether you have hypertension that requires  medical treatment.    If your blood pressure becomes extremely high all of a sudden, you will probably notice symptoms. In fact, very high blood pressure is a medical emergency. Most people with hypertension have blood pressure that is only a little too high. Mild high blood pressure does not cause specific symptoms. Instead, the effects of hypertension develop slowly over time. Untreated hypertension can affect the heart, brain, kidneys, eyes, and blood vessels. Unfortunately, by the time side-effects become noticeable, the body has already been damaged. This is why hypertension is called "the silent killer!"    The physician treating you today decided to start you on a blood pressure medicine. It is VERY IMPORTANT that you follow up with your regular doctor so he or she can follow your progress and adjust the blood pressure medications.    It is important to follow up with your regular doctor. Check your blood pressure several times  in the next 1 to 2 weeks and tell your doctor about the results. It may be helpful to keep a log or a journal where you can write down your blood pressures. Note the time of day and the activity you were doing when the reading was taken.    YOU SHOULD SEEK MEDICAL ATTENTION IMMEDIATELY, EITHER HERE OR AT THE NEAREST EMERGENCY DEPARTMENT, IF ANY OF THE FOLLOWING OCCURS:   You have a headache.   You have chest pain.   You are short of breath or have trouble breathing.    You feel weak, especially on only one side of the body.   Your symptoms get worse or you have other concerns.        MEDICATION: CHLORDIAZEPOXIDE  Chlordiazepoxide (brand: Librium) is used for anxiety, sleeplessness, and symptoms of alcohol withdrawal.    DIRECTIONS FOR USE:  Chlordiazepoxide may be taken with or without meals. Take the medicine at regular intervals as prescribed. If the label says "as needed," this means take it only when you are having symptoms. Do not take it more often than prescribed.    WHAT TO WATCH FOR:  POSSIBLE SIDE EFFECTS: Drowsiness, tiredness, confusion, dizziness, difficulty walking, constipation, restlessness, trembling, blurred vision, headache (Contact your doctor if these symptoms are severe or persist more than a few days). Dry mouth (Suck on a hard candy or chew gum). Slurred speech, tremor, muscle twitches, uncontrollable muscle movements, mood changes, yellowing eyes or skin, persistent nausea/vomiting, dark urine, difficulty passing urine (Stop the medicine and contact your doctor).  ALLERGIC REACTION: Itching, rash, swelling, trouble breathing or swallowing (Contact your doctor or return to this facility promptly).  MEDICAL CONDITIONS: Before starting this medicine, be sure your doctor knows if you have any of the following conditions:   Pregnancy, breastfeeding, kidney or liver disease, lung disease, certain blood disorders  DRUG INTERACTIONS: Before starting this medicine, be sure your doctor knows if  you are taking any of the following drugs:   Sedatives, tranquilizers, phenobarbital, pain medicine, antidepressants, antihistamines, seizure medicines, theophylline products   Cimetidine (Tagamet), digoxin (Lanoxin), Antabuse, levodopa, fluconazole/ketoconazole, calcium channel blockers, conivaptan, birth control pills, erythromycin/clarithromycin  WARNINGS:   Do not drive, ride a bicycle, or operate dangerous equipment while taking this medicine until you know how it will affect you.   Avoid the use of alcohol and other sedatives when taking this drug. Combining these may cause oversedation or unconsciousness.   Prolonged use of this drug may lead to physical and psychological drug dependence. Do not take this drug continuously for an  extended period of time without consulting your doctor.  [NOTE: This information topic may not include all directions, precautions, medical conditions, drug/food interactions, and warnings for this drug. Check with your doctor, nurse, or pharmacist for any questions that you may have.]   [address], [address] [place], [place] [address]. All rights reserved. This information is not intended as a substitute for professional medical care. Always follow your healthcare professional's instructions.    MEDICATION: CLINDAMYCIN  Clindamycin (brand name: Cleocin) is a type of antibiotic used to treat infections.    DIRECTIONS FOR USE:   Take this medicine with a full glass of water, with or without food. If you find that it upsets your stomach, take it with food.   Take the medicine at regular intervals. If the label says "EVERY 6 HOURS", this means 4 times per day.   If you miss a dose, take it as soon as possible. However, if it is almost time for your next dose, skip the missed dose. Do not take two doses at the same time to make up for a missed dose.   To prevent a recurrence of the infection, finish all of the doses that you are given to complete  treatment.   Do not refrigerate the liquid form of this medicine.  Date Time: 09/06/2012 12:06 PM  Attending Physician: Valente David, MD    Date of Admission:   09/03/2012    Reason for Admission:   Alcohol intoxication [305.00]  Head injury [959.01]  Traumatic subarachnoid hemorrhage [852.00]  Multiple facial bone fractures [804.00]    Follow up:        Neurology (If Stroke): ______________________  Phone:______________________    Other:___________________________________   Phone: ______________________       Medications:  Your medications have been listed for you on the Medication Reconciliation Discharge Home List. Please bring a copy of all discharge instructions, including your Medication Reconciliation Discharge Home List when you visit your physician.    Continue taking all medications even if you feel well, unless otherwise instructed by physician.  Do not take any over-the-counter medications or herbal supplements without checking with your pharmacist or doctor.     Activity:  Rise slowly from a sitting or lying position. Increase activity slowly, unless otherwise instructed by physician.  Perform exercises as desginated by Therapist and Physician.  In the event of severe shortness of breath or chest discomfort, call 911. Do NOT drive to the hospital.  Speak with your physician regarding specific driving and/or work restrictions.    Diet:  If you have special diet orders, you have been given printed diet instructions.   ________________________________________________________________________    Tobacco Cessation Counseling:  If you are currently a tobacco user or have used tobacco within the last 12 months, we have provided you with written Tobacco Cessation Counseling.  ________________________________________________________________________    Heart Failure Education:  If you have a diagnosis of a Heart Failure, we have provided you with written Heart Failure Education.   Weigh yourself once a day  at the same time. Record and bring the weight record to your next physician appointment.   Call your doctor if you gain more than 3 pounds in one day or 5 pounds in one week, or if you experience shortness of breath, leg swelling and/or chest discomfort.   Enroll in the [facility] Tel-Assurance Program, a heart failure patient support program. Call 610-702-2021 for additional details.   ________________________________________________________________________    [facility] Education:  Call  911 for:  Sudden numbness or weakness of the face  Sudden numbness of the arm or leg especially one side of the body  Sudden confusion, trouble speaking or understanding  Sudden trouble seeing in one or both eyes  Sudden trouble walking or dizziness, loss of balance or coordination      For promotion of your health, we have provided you with personalized written education on risk factors specific to your diagnosis, including but not limited to:  High Blood Pressure  High Cholesterol  Atrial Fibrillation  Overweight  Diabetes  Smoking         Vaccinations  Pneumonia Vaccine Received on:  Flu Vaccine Received on:             Treatments/Special Instructions:   ***               Signed by: Rexene Alberts, RN    I HAVE RECEIVED AND UNDERSTAND THESE DISCHARGE INSTRUCTIONS.              Patient Education  Sulfacetamide Sodium Ophthalmic drops, solution   Sulfacetamide Sodium Ophthalmic ointment   Sulfacetamide Sodium Topical cream   Sulfacetamide Sodium Topical emulsion   Sulfacetamide Sodium Topical foam   Sulfacetamide Sodium Topical gel   Sulfacetamide Sodium Topical lotion   Sulfacetamide Sodium Topical pad, cleanser   Sulfacetamide Sodium Topical suspension   Sulfacetamide Sodium Topical suspension, cleanser   Sulfacetamide Sodium Ophthalmic drops, solution  What is this medicine?  SULFACETAMIDE (sul fa SEE ta mide) is a sulfonamide antibiotic. It is used to treat eye infections.  This medicine may be used for other purposes; ask your  health care provider or pharmacist if you have questions.  What should I tell my health care provider before I take this medicine?  They need to know if you have any of these conditions:  eye injury or eye surgery  an unusual or allergic reaction to sulfacetamide, sulfa drugs, other medicines, foods, dyes, or preservatives  pregnant or trying to get pregnant  breast-feeding  How should I use this medicine?  This medicine is only for use in the eye. Do not take by mouth. Follow the directions on the prescription label. Wash hands before and after use. Tilt your head back slightly and pull your lower eyelid down with your index finger to form a pouch. Try not to touch the tip of the dropper to your eye, fingertips, or any other surface. Squeeze the prescribed number of drops into the pouch. Close the eye gently to spread the drops. Your vision may blur for a few minutes. Use your doses at regular intervals. Do not use your medicine more often than directed. Finish the full course prescribed by your doctor or health care professional even if you think your condition is better.  Talk to your pediatrician regarding the use of this medicine in children. Special care may be needed.  Overdosage: If you think you have taken too much of this medicine contact a poison control center or emergency room at once.  NOTE: This medicine is only for you. Do not share this medicine with others.  What if I miss a dose?  If you miss a dose, use it as soon as you can. If it is almost time for your next dose, use only that dose. Do not use double or extra doses.  What may interact with this medicine?  eye products that contain silver  This list may not describe all possible interactions. Give your  health care provider a list of all the medicines, herbs, non-prescription drugs, or dietary supplements you use. Also tell them if you smoke, drink alcohol, or use illegal drugs. Some items may interact with your medicine.  What should I watch  for while using this medicine?  Tell your doctor or health care professional if your symptoms do not get better in 2 to 3 days. A full course of treatment is usually 7 to 10 days.  If you get any sign of an allergic reaction, stop using your eye product and call your doctor or health care professional.  Wear sunglasses if this medicine makes your eyes more sensitive to light. Keep out of the sun, or wear protective clothing outdoors and use a sunscreen. Do not use sun lamps or sun tanning beds or booths.  What side effects may I notice from receiving this medicine?  Side effects that you should report to your doctor or health care professional as soon as possible:  blurred vision that does not go away  burning, blistering, peeling, stinging, or itching of the eyes or eyelids, skin or mouth  eye redness, swelling, or pain  Side effects that usually do not require medical attention (report to your doctor or health care professional if they continue or are bothersome):  blurred vision for a few moments after application  This list may not describe all possible side effects. Call your doctor for medical advice about side effects. You may report side effects to FDA at 1-800-FDA-1088.  Where should I keep my medicine?  Keep out of the reach of children.  Store between 2 and 30 degrees C (36 and 86 degrees F). Do not freeze. Throw away any unused eye products after the expiration date.  NOTE:This sheet is a summary. It may not cover all possible information. If you have questions about this medicine, talk to your doctor, pharmacist, or health care provider. Copyright 2013 [employer] Standard      WHAT TO WATCH FOR:  POSSIBLE SIDE EFFECTS: Nausea, vomiting, loss of appetite, heartburn, metallic taste: Contact your doctor if any of these symptoms persist or become severe. Rash, severe diarrhea (watery or blood), yellow skin or eyes, severe abdominal pain, reduced urine output: Stop the medicine and contact your doctor or this  facility.  ALLERGIC REACTION: Rash, itching, swelling, trouble breathing or swallowing, weakness or fainting: Contact your doctor or return to this facility promptly.  MEDICAL CONDITIONS: Before starting this medicine, be sure your doctor knows if you have any of the following conditions:   Breastfeeding, pregnancy   Liver or kidney disease   Stomach or intestinal disease (especially colitis caused by an antibiotic)  DRUG INTERACTION: Before taking this drug, be sure your doctor knows if you are taking any of the following:   Erythromycin (E-Mycin, EryTab, E.E.S, Eryc, Ilosone, and others), chloramphenicol (Chloromycetin and others)   Kaopectate (over-the-counter medicine for diarrhea with kaolin and pectin)  [NOTE: This information topic may not include all directions, precautions, medical conditions, drug/food interactions and warnings for this drug. Check with your doctor, nurse, or pharmacist for any questions that you may have.]   [address] [place], [place] [address]. All rights reserved. This information is not intended as a substitute for professional medical care. Always follow your healthcare professional's instructions.

## 2012-09-06 NOTE — Final Progress Note (DC Note for stay less than 48 (Signed)
Pt discharged. Alert and oriented x3. Vital signs stable. Pt ambulated in the hallway with steady gait. D/C instructions given with scripts, pt verbalized understanding. Pt stated she couldn't afford medication, offered case management consult. Pt refused. Telemetry and IV lines discontinued. Pt refused wheelchair so was escorted by staff and accompanied by family.
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
## 2012-09-06 NOTE — Plan of Care (Signed)
Problem: Pain  Goal: Patient's pain/discomfort is manageable  Outcome: Progressing  PROGRESS NOTE    Date Time: 09/06/2012 2:11 AM  Patient Name: Linda Mclaughlin, Linda Mclaughlin        Assessment:   Pt is alert and oriented X 3. Blood pressure has been running high, clonidine has been given prn. All other vital signs are stable. BP was keeping pt from being discharged. Pt has bruising on the right side of her face, nose is broken on the bridge. CIWA = 1. No tremors, no disturbances. Pt complains of mild anxiety, pt is receiving Librium.        Plan:   Possible [place] later today is BP remains stable. Monitor cardiac status. Monitor BP. Maintain safety precautions.       Subjective:   Pt denies nausea and vomiting.       Medications:       Current Facility-Administered Medications   Medication Status Dose Route Frequency   . amLODIPine Active  10 mg Oral Daily   . atenolol Active  50 mg Oral Daily   . chlordiazePOXIDE Active  25 mg Oral Q6H   . clindamycin Active  900 mg Intravenous Q8H SCH   . IV fluids with MVI, thiamine, folic acid Active   Intravenous Daily   . pantoprazole Active  40 mg Intravenous Daily   . sulfacetamide Active  2 drop Both Eyes Q6H SCH         Review of Systems:   A comprehensive review of systems was: History obtained from chart review and the patient      Physical Exam:       Filed Vitals:     09/05/12 2321   BP: 142/88   Pulse: 60   Temp: 97 F (36.1 C)   Resp: 18   SpO2: 100%       Intake and Output Summary (Last 24 hours) at Date Time  No intake or output data in the 24 hours ending 09/06/12 0211    Mental status - alert, oriented to person, place, and time      Labs:       Results      Procedure Component Value Units Date/Time     CBC without differential [[id number]]  (Abnormal) Collected:09/05/12 0408     Specimen Information:Blood Updated:09/05/12 0511       WBC 7.72 x10 3/uL         RBC 4.02 (L) x10 6/uL         Hgb 13.8 g/dL         Hematocrit 16.1 %         MCV 104.0 (H) fL         MCH 34.3 (H)  pg         MCHC 33.0 g/dL         RDW 13 %         Platelets 204 x10 3/uL         MPV 10.2 fL         Nucleated RBC 0 /100 WBC       Comprehensive metabolic panel [[id number]]  (Abnormal) Collected:09/05/12 0408     Specimen Information:Blood Updated:09/05/12 0455       Glucose 82 mg/dL         BUN 5.0 (L) mg/dL         Creatinine 0.7 mg/dL         Sodium 096 (L) mEq/L         Potassium  3.8 mEq/L         Chloride 102 mEq/L         CO2 22 mEq/L         Calcium 9.0 mg/dL         Protein, Total 6.6 g/dL         Albumin 3.4 (L) g/dL         AST (SGOT) 31 U/L         ALT 25 U/L         Alkaline Phosphatase 73 U/L         Bilirubin, Total 1.3 (H) mg/dL         Globulin 3.2 g/dL         Albumin/Globulin Ratio 1.1         Anion Gap 11.0       HEMOLYZED INDEX [[id number]] Collected:09/05/12 0408       Hemolyzed Index 4 Index Updated:09/05/12 0455     GFR [[id number]] Collected:09/05/12 0408       EGFR >60.0   Updated:09/05/12 0455           Recent CBC   Recent Labs   Basename 09/05/12 0408     RBC 4.02*     HGB 13.8     HCT 41.8     MCV 104.0*     MCH 34.3*     MCHC 33.0     RDW 13     MPV 10.2     LABPLAT --     Recent BMP   Recent Labs   Basename 09/05/12 0408     GLU 82     BUN 5.0*     CREAT 0.7     CA 9.0     NA 135*     K 3.8     CL 102     CO2 22         Rads:   Radiological Procedure reviewed.    Signed by: Leana Roe

## 2013-11-23 ENCOUNTER — Emergency Department
Admission: EM | Admit: 2013-11-23 | Discharge: 2013-11-23 | Discharge status: Against Medical Advice | Payer: Self-pay | Attending: Emergency Medicine | Admitting: Emergency Medicine

## 2013-11-23 DIAGNOSIS — Z5320 Procedure and treatment not carried out because of patient's decision for unspecified reasons: Secondary | ICD-10-CM | POA: Insufficient documentation

## 2014-04-01 ENCOUNTER — Emergency Department: Payer: Self-pay

## 2014-04-01 ENCOUNTER — Emergency Department
Admission: EM | Admit: 2014-04-01 | Discharge: 2014-04-01 | Disposition: A | Discharge status: Home / Self Care | Payer: Self-pay | Attending: Emergency Medicine | Admitting: Emergency Medicine

## 2014-04-01 DIAGNOSIS — F17200 Nicotine dependence, unspecified, uncomplicated: Secondary | ICD-10-CM | POA: Insufficient documentation

## 2014-04-01 DIAGNOSIS — F10939 Alcohol use, unspecified with withdrawal, unspecified: Secondary | ICD-10-CM | POA: Insufficient documentation

## 2014-04-01 DIAGNOSIS — I10 Essential (primary) hypertension: Secondary | ICD-10-CM | POA: Insufficient documentation

## 2014-04-01 DIAGNOSIS — F1020 Alcohol dependence, uncomplicated: Secondary | ICD-10-CM | POA: Insufficient documentation

## 2014-04-01 LAB — POCT URINALYSIS AUTOMATED (IAH)
Glucose, UA POCT: NEGATIVE
Ketones, UA POCT: 40 mg/dL — AB
Nitrite, UA POCT: NEGATIVE
PH, UA POCT: 5.5 (ref 4.6–8)
Protein, UA POCT: 100 mg/dL — AB
Specific Gravity, UA POCT: >=1.03 mg/dL (ref 1.001–1.035)
Urobilinogen, UA POCT: 0.2 mg/dL

## 2014-04-01 LAB — CBC AND DIFFERENTIAL
Basophils Absolute Automated: 0.01 10*3/uL (ref 0.00–0.20)
Basophils Automated: 0 %
Eosinophils Absolute Automated: 0.07 10*3/uL (ref 0.00–0.70)
Eosinophils Automated: 1 %
Hematocrit: 44.1 % (ref 37.0–47.0)
Hgb: 14.8 g/dL (ref 12.0–16.0)
Immature Granulocytes Absolute: 0.02 10*3/uL
Immature Granulocytes: 0 %
Lymphocytes Absolute Automated: 1.6 10*3/uL (ref 0.50–4.40)
Lymphocytes Automated: 20 %
MCH: 32.6 pg — ABNORMAL HIGH (ref 28.0–32.0)
MCHC: 33.6 g/dL (ref 32.0–36.0)
MCV: 97.1 fL (ref 80.0–100.0)
MPV: 11.1 fL (ref 9.4–12.3)
Monocytes Absolute Automated: 0.53 10*3/uL (ref 0.00–1.20)
Monocytes: 6 %
Neutrophils Absolute: 6.01 10*3/uL (ref 1.80–8.10)
Neutrophils: 73 %
Nucleated RBC: 0 /100 WBC (ref 0–1)
Platelets: 281 10*3/uL (ref 140–400)
RBC: 4.54 10*6/uL (ref 4.20–5.40)
RDW: 14 % (ref 12–15)
WBC: 8.22 10*3/uL (ref 3.50–10.80)

## 2014-04-01 LAB — RAPID DRUG SCREEN, URINE
Barbiturate Screen, UR: NEGATIVE
Benzodiazepine Screen, UR: NEGATIVE
Cannabinoid Screen, UR: NEGATIVE
Cocaine, UR: NEGATIVE
Opiate Screen, UR: NEGATIVE
PCP Screen, UR: NEGATIVE
Urine Amphetamine Screen: NEGATIVE

## 2014-04-01 LAB — POCT PREGNANCY TEST, URINE HCG: POCT Pregnancy HCG Test, UR: NEGATIVE

## 2014-04-01 LAB — COMPREHENSIVE METABOLIC PANEL
ALT: 13 U/L (ref 0–55)
AST (SGOT): 17 U/L (ref 5–34)
Albumin/Globulin Ratio: 1.3 (ref 0.9–2.2)
Albumin: 4.3 g/dL (ref 3.5–5.0)
Alkaline Phosphatase: 77 U/L (ref 40–150)
Anion Gap: 12 (ref 5.0–15.0)
BUN: 6 mg/dL — ABNORMAL LOW (ref 7.0–19.0)
Bilirubin, Total: 0.7 mg/dL (ref 0.2–1.2)
CO2: 24 mEq/L (ref 22–29)
Calcium: 10.1 mg/dL (ref 8.5–10.5)
Chloride: 100 mEq/L (ref 98–107)
Creatinine: 0.8 mg/dL (ref 0.6–1.0)
Globulin: 3.4 g/dL (ref 2.0–3.6)
Glucose: 119 mg/dL — ABNORMAL HIGH (ref 70–100)
Potassium: 3.9 mEq/L (ref 3.5–5.1)
Protein, Total: 7.7 g/dL (ref 6.0–8.3)
Sodium: 136 mEq/L (ref 136–145)

## 2014-04-01 LAB — LIPASE: Lipase: 13 U/L (ref 8–78)

## 2014-04-01 LAB — ETHANOL: Alcohol: NOT DETECTED mg/dL

## 2014-04-01 LAB — TROPONIN I: Troponin I: <0.01 ng/mL

## 2014-04-01 LAB — SALICYLATE LEVEL: Salicylate Level: <5 mg/dL — ABNORMAL LOW (ref 15.0–30.0)

## 2014-04-01 LAB — HEMOLYSIS INDEX: Hemolysis Index: 16 (ref 0–18)

## 2014-04-01 LAB — ACETAMINOPHEN LEVEL: Acetaminophen Level: <3 ug/mL — ABNORMAL LOW (ref 10–30)

## 2014-04-01 LAB — GFR: EGFR: >60

## 2014-04-01 MED ORDER — SODIUM CHLORIDE 0.9 % IV BOLUS
1000.00 mL | Freq: Once | INTRAVENOUS | Status: AC
Start: 2014-04-01 — End: 2014-04-01
  Administered 2014-04-01: 1000 mL via INTRAVENOUS

## 2014-04-01 MED ORDER — LORAZEPAM 1 MG PO TABS
1.0000 mg | ORAL_TABLET | Freq: Four times a day (QID) | ORAL | Status: DC | PRN
Start: 2014-04-01 — End: 2015-03-02

## 2014-04-01 MED ORDER — HYDRALAZINE HCL 20 MG/ML IJ SOLN
10.0000 mg | Freq: Once | INTRAMUSCULAR | Status: AC
Start: 2014-04-01 — End: 2014-04-01
  Administered 2014-04-01: 10 mg via INTRAVENOUS
  Filled 2014-04-01: qty 1

## 2014-04-01 MED ORDER — LORAZEPAM 1 MG PO TABS
2.0000 mg | ORAL_TABLET | Freq: Once | ORAL | Status: AC
Start: 2014-04-01 — End: 2014-04-01
  Administered 2014-04-01: 2 mg via ORAL
  Filled 2014-04-01: qty 2

## 2014-04-01 NOTE — ED Notes (Signed)
Here for medical clearance for detox. Pt reports that her last drink was on Sunday, unknown how much she drinks on any given day, states that she doesn't know when to stop. appears alert and calm at this time. States that detox is saving a bed for her today.

## 2014-04-01 NOTE — Discharge Instructions (Signed)
Linda Mclaughlin was seen in the [facility] Emergency Room for high blood pressure. She is medically cleared for detox. I suspect that her high blood pressure is part of her body's response to withdrawing from alcohol. The blood pressure should improve as her body gets used to not having alcohol. Ativan should help with her blood pressure and other withdrawal symptoms. She received one dose of ativan in the ED and a prescription for ativan. She was given referrals to primary care doctors once she is done with detox. She should return to the ED immediately for numbness, weakness, seizures, confusion, headache, uncontrollable vomiting, inability to walk, or any other concerns.

## 2014-04-01 NOTE — ED Provider Notes (Signed)
EMERGENCY DEPARTMENT HISTORY AND PHYSICAL EXAM     Physician/Midlevel provider first contact with patient: 04/01/14 1316         Date: 04/01/2014  Patient Name: Linda Mclaughlin    History of Presenting Illness     Chief Complaint   Patient presents with   . Inpatient Detox       History Provided By: pt    Chief Complaint: medical clearance for detox  Onset: today   Quality: elevated BP  Severity: moderate  Associated Symptoms: EtOH abuse    Additional History: Linda Mclaughlin is a 33 y.o. female presenting to the ED seeking medical clearance for detox (EtOH). Pt attempted to check into detox today PTA, but was referred to the ED due to having an elevated BP. She reports last drinking EtOH 3 days ago. She says she does not drink daily, but when she does, it is out of control. She denies having any withdrawal Sx such as seizures or shaking. Denies CP, SOB, or any other recent ill Sx.     PCP: Pcp, Noneorunknown, MD      Current Facility-Administered Medications   Medication Dose Route Frequency Provider Last Rate Last Dose   . [COMPLETED] hydrALAZINE (APRESOLINE) injection 10 mg  10 mg Intravenous Once Maryella Shivers, MD   10 mg at 04/01/14 1521   . [COMPLETED] LORazepam (ATIVAN) tablet 2 mg  2 mg Oral Once Maryella Shivers, MD   2 mg at 04/01/14 1640   . [COMPLETED] sodium chloride 0.9 % bolus 1,000 mL  1,000 mL Intravenous Once Maryella Shivers, MD   1,000 mL at 04/01/14 1342     Current Outpatient Prescriptions   Medication Sig Dispense Refill   . LORazepam (ATIVAN) 1 MG tablet Take 1 tablet (1 mg total) by mouth every 6 (six) hours as needed (alcohol withdrawal symptoms).  8 tablet  0       Past History     Past Medical History:  Past Medical History   Diagnosis Date   . No known health problems    . Alcohol abuse        Past Surgical History:  Past Surgical History   Procedure Date   . Ankle surgery        Family History:  No family history on file.    Social History:  History   Substance Use Topics   .  Smoking status: Current Every Day Smoker -- 0.2 packs/day for 10 years   . Smokeless tobacco: Not on file   . Alcohol Use: Yes      Comment: occasional       Allergies:  Allergies   Allergen Reactions   . Penicillins    . Pineapple        Review of Systems     Review of Systems   Constitutional: Negative for fever and malaise/fatigue.        +elevated BP   HENT: Negative for nosebleeds.    Eyes: Negative for blurred vision and double vision.   Respiratory: Negative for cough and shortness of breath.    Cardiovascular: Negative for chest pain.   Gastrointestinal: Negative for nausea, vomiting and abdominal pain.   Genitourinary: Negative for dysuria.   Musculoskeletal: Negative for back pain, falls and myalgias.   Skin: Negative for rash.   Neurological: Negative for tremors, seizures, weakness and headaches.   Endo/Heme/Allergies: Does not bruise/bleed easily.   Psychiatric/Behavioral: Positive for substance abuse (EtOH). Negative for depression  and suicidal ideas. The patient is not nervous/anxious.          Physical Exam   BP 177/119  Pulse 75  Temp 96.7 F (35.9 C) (Oral)  Resp 20  Wt 65 kg  SpO2 98%  LMP 03/12/2014    Constitutional: Vital signs reviewed. Well appearing. No distress.  Head: Normocephalic, atraumatic  Eyes: Conjunctiva and sclera are normal.  No injection or discharge.  Ears, Nose, Throat:  Normal external examination of the nose and ears.  Mucous membranes moist.  Neck: Normal range of motion. Supple, no meningeal signs. Trachea midline.  Respiratory/Chest: Clear to auscultation. No respiratory distress.   Cardiovascular: Regular rate and rhythm. No murmurs.  Abdomen:  Bowel sounds intact. No rebound or guarding. Soft.  Non-tender.  Back: no cva tenderness to percussion.  Upper Extremity:  No edema. No cyanosis. Bilateral radial pulses intact and equal.   Lower Extremity:  No edema. No cyanosis. Bilateral calves symmetrical and non-tender.  Skin: Warm and dry. No rash.  Neuro: CNII -XII  intact to testing. Strength 5/5 and symmetrical in the bilateral upper and lower extremities. Sensation to sharp touch intact and equal in the bilateral upper and lower extremities. Coordination intact to finger to nose testing . Normal gait.   Psychiatric:  Normal affect.  Normal insight.      Diagnostic Study Results     Labs -     Results     Procedure Component Value Units Date/Time    Urine Rapid Drug Screen [[id number]] Collected:04/01/14 1431    Specimen Information:Urine Updated:04/01/14 1504     Amphetamine Screen, UR Negative      Barbiturate Screen, UR Negative      Benzodiazepine Screen, UR Negative      Cannabinoid Screen, UR Negative      Cocaine, UR Negative      Opiate Screen, UR Negative      PCP Screen, UR Negative     Troponin I [[id number]] Collected:04/01/14 1328    Specimen Information:Blood Updated:04/01/14 1426     Troponin I <0.01 ng/mL     Alcohol (Ethanol) Level [[id number]] Collected:04/01/14 1328    Specimen Information:Blood Updated:04/01/14 1411     Alcohol None Detected mg/dL     Hemolysis index [[id number]] Collected:04/01/14 1328     Hemolysis Index 16 Updated:04/01/14 1411    GFR [[id number]] Collected:04/01/14 1328     EGFR >60.0 Updated:04/01/14 1411    Comprehensive Metabolic Panel (CMP) [[id number]]  (Abnormal) Collected:04/01/14 1328    Specimen Information:Blood Updated:04/01/14 1411     Glucose 119 (H) mg/dL      BUN 6.0 (L) mg/dL      Creatinine 0.8 mg/dL      Sodium 027 mEq/L      Potassium 3.9 mEq/L      Chloride 100 mEq/L      CO2 24 mEq/L      CALCIUM 10.1 mg/dL      Protein, Total 7.7 g/dL      Albumin 4.3 g/dL      AST (SGOT) 17 U/L      ALT 13 U/L      Alkaline Phosphatase 77 U/L      Bilirubin, Total 0.7 mg/dL      Globulin 3.4 g/dL      Albumin/Globulin Ratio 1.3      Anion Gap 12.0     Lipase [[id number]] Collected:04/01/14 1328    Specimen Information:Blood Updated:04/01/14 1411     Lipase 13 U/L  Acetaminophen level [[id number]]  (Abnormal) Collected:04/01/14 1328     Specimen Information:Blood Updated:04/01/14 1411     Acetaminophen Level <3 (L) ug/mL     Salicylate Level [[id number]]  (Abnormal) Collected:04/01/14 1328    Specimen Information:Blood Updated:04/01/14 1411     Salicylate Level <5.0 (L) mg/dL     UA POC (POCT UA Clinitek AX) [[id number]]  (Abnormal) Collected:04/01/14 1341    Specimen Information:Urine Updated:04/01/14 1345     Color UA POCT Amber      Clarity UA POCT Cloudy      Glucose, UA POCT Negative      Bilirubin, UA POCT Small (A)      Ketones, UA POCT =40 (A) mg/dL      Specific Gravity, UA POCT >=1.030 mg/dL      Blood, UA POCT  Small (A)      PH, UA POCT 5.5      Protein, UA POCT =100 (A) mg/dL      Urobilinogen, UA POCT 0.2 mg/dL      Nitrite, UA POCT Negative      Leukocytes, UA POCT Trace  (A)     Urine HCG POC [[id number]] Collected:04/01/14 1343    Specimen Information:Urine Updated:04/01/14 1344     POCT QC Pass      POCT Pregnancy HCG Test, UR Negative      Comment:        Result:     Negative Value is Normal in Healthy Males or Healthy non-pregnant Females          Radiologic Studies -   Radiology Results (24 Hour)     ** No Results found for the last 24 hours. **      .      Medical Decision Making   I am the first provider for this patient.    I reviewed the vital signs, available nursing notes, past medical history, past surgical history, family history and social history.    Vital Signs-Reviewed the patient's vital signs.     Patient Vitals for the past 12 hrs:   BP Temp Pulse Resp   04/01/14 1601 177/119 mmHg 96.7 F (35.9 C) 75  20    04/01/14 1337 180/101 mmHg 98.4 F (36.9 C) 71  16    04/01/14 1242 168/101 mmHg 98.2 F (36.8 C) 102  18        Pulse Oximetry Analysis - Normal 100% on RA    EKG:  Interpreted by the EP.   Time Interpreted: 1405   Rate: 64   Rhythm: Normal Sinus Rhythm    Interpretation: no st elevation   Comparison: No prior study is available for comparison.    Old Medical Records: Nursing notes.     ED Course:     4:15 PM  - d/w detox, they accept the pt.    Provider Notes: Pt with history of chronic EtOH abuse sent to ED for medical clearance from EtOH detox with uncontrolled, asymptomatic HTN in the setting of no EtOH for past 3 days. Suspect HTN is secondary to not having alcohol. No acute process on labs. Non-focal neuro exam. No CP or SOB. Given rx for ativan with first dose in the ED. Discussed with detox my impression that's HTN is related to not having EtOH and that it should improve as pt's body acclimates to not having EtOH and that ativan will help her transition. They accepted pt back to detox. Also provided pt with referral to primary care once she is  finished with detox. Pt still hypertensive at discharge but given overall clinical context and non-toxic appearance, I think she should be safe for discharge. Reviewed red flags for return to ED and pt voiced understanding.        Diagnosis     Clinical Impression:   1. Alcohol withdrawal, with unspecified complication    2. Hypertension        _______________________________    Attestations:  This note is prepared by [employer] acting as Scribe for Lynnea Ferrier, MD.    Lynnea Ferrier, MD. - The scribe's documentation has been prepared under my direction and personally reviewed by me in its entirety.  I confirm that the note above accurately reflects all work, treatment, procedures, and medical decision making performed by me.      _______________________________          Maryella Shivers, MD  04/01/14 [phone]

## 2014-04-02 LAB — ECG 12-LEAD
Atrial Rate: 64 {beats}/min
P Axis: 11 degrees
P-R Interval: 142 ms
Q-T Interval: 422 ms
QRS Duration: 96 ms
QTC Calculation (Bezet): 435 ms
R Axis: 53 degrees
T Axis: 34 degrees
Ventricular Rate: 64 {beats}/min

## 2014-04-03 NOTE — Progress Notes (Signed)
Quick Note:    CBC and differential results have been reviewed by DR. Hillis Range.  ______

## 2015-02-28 ENCOUNTER — Observation Stay: Payer: Charity | Admitting: Internal Medicine

## 2015-02-28 ENCOUNTER — Observation Stay
Admission: EM | Admit: 2015-02-28 | Discharge: 2015-03-02 | Disposition: A | Discharge status: Home / Self Care | Payer: Charity | Attending: Internal Medicine | Admitting: Internal Medicine

## 2015-02-28 DIAGNOSIS — E860 Dehydration: Secondary | ICD-10-CM

## 2015-02-28 DIAGNOSIS — F1910 Other psychoactive substance abuse, uncomplicated: Secondary | ICD-10-CM | POA: Insufficient documentation

## 2015-02-28 DIAGNOSIS — F17210 Nicotine dependence, cigarettes, uncomplicated: Secondary | ICD-10-CM | POA: Insufficient documentation

## 2015-02-28 DIAGNOSIS — M6282 Rhabdomyolysis: Principal | ICD-10-CM | POA: Insufficient documentation

## 2015-02-28 DIAGNOSIS — S40022A Contusion of left upper arm, initial encounter: Secondary | ICD-10-CM

## 2015-02-28 DIAGNOSIS — N390 Urinary tract infection, site not specified: Secondary | ICD-10-CM | POA: Insufficient documentation

## 2015-02-28 DIAGNOSIS — T50993A Poisoning by other drugs, medicaments and biological substances, assault, initial encounter: Secondary | ICD-10-CM | POA: Insufficient documentation

## 2015-02-28 DIAGNOSIS — R410 Disorientation, unspecified: Secondary | ICD-10-CM | POA: Insufficient documentation

## 2015-02-28 DIAGNOSIS — F19921 Other psychoactive substance use, unspecified with intoxication with delirium: Secondary | ICD-10-CM

## 2015-02-28 DIAGNOSIS — E876 Hypokalemia: Secondary | ICD-10-CM | POA: Insufficient documentation

## 2015-02-28 LAB — MAGNESIUM: Magnesium: 1.9 mg/dL (ref 1.6–2.6)

## 2015-02-28 LAB — CBC AND DIFFERENTIAL
Basophils Absolute Automated: 0.02 10*3/uL (ref 0.00–0.20)
Basophils Automated: 0 %
Eosinophils Absolute Automated: 0.06 10*3/uL (ref 0.00–0.70)
Eosinophils Automated: 1 %
Hematocrit: 39.8 % (ref 37.0–47.0)
Hgb: 13.3 g/dL (ref 12.0–16.0)
Immature Granulocytes Absolute: 0.04 10*3/uL
Immature Granulocytes: 0 %
Lymphocytes Absolute Automated: 1.37 10*3/uL (ref 0.50–4.40)
Lymphocytes Automated: 13 %
MCH: 32.3 pg — ABNORMAL HIGH (ref 28.0–32.0)
MCHC: 33.4 g/dL (ref 32.0–36.0)
MCV: 96.6 fL (ref 80.0–100.0)
MPV: 10.3 fL (ref 9.4–12.3)
Monocytes Absolute Automated: 1.31 10*3/uL — ABNORMAL HIGH (ref 0.00–1.20)
Monocytes: 13 %
Neutrophils Absolute: 7.49 10*3/uL (ref 1.80–8.10)
Neutrophils: 73 %
Nucleated RBC: 0 /100 WBC (ref 0–1)
Platelets: 235 10*3/uL (ref 140–400)
RBC: 4.12 10*6/uL — ABNORMAL LOW (ref 4.20–5.40)
RDW: 14 % (ref 12–15)
WBC: 10.25 10*3/uL (ref 3.50–10.80)

## 2015-02-28 LAB — HEMOLYSIS INDEX
Hemolysis Index: 20 — ABNORMAL HIGH (ref 0–18)
Hemolysis Index: 1 (ref 0–18)
Hemolysis Index: 4 (ref 0–18)

## 2015-02-28 LAB — RAPID DRUG SCREEN, URINE
Barbiturate Screen, UR: NEGATIVE
Benzodiazepine Screen, UR: NEGATIVE
Cannabinoid Screen, UR: NEGATIVE
Cocaine, UR: NEGATIVE
Opiate Screen, UR: NEGATIVE
PCP Screen, UR: NEGATIVE
Urine Amphetamine Screen: NEGATIVE

## 2015-02-28 LAB — COMPREHENSIVE METABOLIC PANEL
ALT: 16 U/L (ref 0–55)
AST (SGOT): 43 U/L — ABNORMAL HIGH (ref 5–34)
Albumin/Globulin Ratio: 1.2 (ref 0.9–2.2)
Albumin: 3.9 g/dL (ref 3.5–5.0)
Alkaline Phosphatase: 66 U/L (ref 37–106)
Anion Gap: 16 — ABNORMAL HIGH (ref 5.0–15.0)
BUN: 9 mg/dL (ref 7.0–19.0)
Bilirubin, Total: 0.7 mg/dL (ref 0.2–1.2)
CO2: 21 mEq/L — ABNORMAL LOW (ref 22–29)
Calcium: 9.4 mg/dL (ref 8.5–10.5)
Chloride: 99 mEq/L — ABNORMAL LOW (ref 100–111)
Creatinine: 0.9 mg/dL (ref 0.6–1.0)
Globulin: 3.2 g/dL (ref 2.0–3.6)
Glucose: 83 mg/dL (ref 70–100)
Potassium: 3.1 mEq/L — ABNORMAL LOW (ref 3.5–5.1)
Protein, Total: 7.1 g/dL (ref 6.0–8.3)
Sodium: 136 mEq/L (ref 136–145)

## 2015-02-28 LAB — URINALYSIS, REFLEX TO MICROSCOPIC EXAM IF INDICATED
Bilirubin, UA: NEGATIVE
Glucose, UA: NEGATIVE
Ketones UA: 20 — AB
Nitrite, UA: NEGATIVE
Protein, UR: 100 — AB
Specific Gravity UA: 1.016 (ref 1.001–1.035)
Urine pH: 6 (ref 5.0–8.0)
Urobilinogen, UA: NEGATIVE mg/dL

## 2015-02-28 LAB — POCT PREGNANCY TEST, URINE HCG: POCT Pregnancy HCG Test, UR: NEGATIVE

## 2015-02-28 LAB — CREATINE KINASE W/O REFLEX (SOFT)
Creatine Kinase (CK): 3510 U/L — ABNORMAL HIGH (ref 29–168)
Creatine Kinase (CK): 3008 U/L — ABNORMAL HIGH (ref 29–168)

## 2015-02-28 LAB — GFR: EGFR: >60

## 2015-02-28 LAB — ACETAMINOPHEN LEVEL: Acetaminophen Level: <6 ug/mL — ABNORMAL LOW (ref 10–30)

## 2015-02-28 LAB — SALICYLATE LEVEL: Salicylate Level: <5 mg/dL — ABNORMAL LOW (ref 15.0–30.0)

## 2015-02-28 LAB — ETHANOL: Alcohol: NOT DETECTED mg/dL

## 2015-02-28 MED ORDER — LORAZEPAM 2 MG/ML IJ SOLN
2.0000 mg | INTRAMUSCULAR | Status: DC | PRN
Start: 2015-02-28 — End: 2015-03-02

## 2015-02-28 MED ORDER — LORAZEPAM 2 MG/ML IJ SOLN
1.0000 mg | Freq: Once | INTRAMUSCULAR | Status: AC
Start: 2015-02-28 — End: 2015-02-28
  Administered 2015-02-28: 1 mg via INTRAVENOUS
  Filled 2015-02-28: qty 1

## 2015-02-28 MED ORDER — FOLIC ACID 5 MG/ML IJ SOLN
Freq: Once | INTRAMUSCULAR | Status: AC
Start: 2015-02-28 — End: 2015-02-28
  Filled 2015-02-28: qty 1000

## 2015-02-28 MED ORDER — SODIUM CHLORIDE 0.45 % IV SOLN
INTRAVENOUS | Status: DC
Start: 2015-02-28 — End: 2015-03-01

## 2015-02-28 MED ORDER — CLONIDINE HCL 0.1 MG PO TABS
0.1000 mg | ORAL_TABLET | Freq: Four times a day (QID) | ORAL | Status: DC | PRN
Start: 2015-02-28 — End: 2015-03-02

## 2015-02-28 MED ORDER — SODIUM CHLORIDE 0.9 % IV BOLUS
1000.0000 mL | Freq: Once | INTRAVENOUS | Status: AC
Start: 2015-02-28 — End: 2015-02-28
  Administered 2015-02-28: 1000 mL via INTRAVENOUS

## 2015-02-28 MED ORDER — MAGNESIUM SULFATE IN D5W 10-5 MG/ML-% IV SOLN
1.0000 g | Freq: Once | INTRAVENOUS | Status: DC
Start: 2015-02-28 — End: 2015-03-02

## 2015-02-28 MED ORDER — SODIUM CHLORIDE 0.9 % IV SOLN
100.0000 mL/h | INTRAVENOUS | Status: DC
Start: 2015-02-28 — End: 2015-02-28

## 2015-02-28 MED ORDER — LORAZEPAM 2 MG/ML IJ SOLN
2.0000 mg | Freq: Once | INTRAMUSCULAR | Status: AC
Start: 2015-02-28 — End: 2015-02-28
  Administered 2015-02-28: 2 mg via INTRAVENOUS
  Filled 2015-02-28: qty 1

## 2015-02-28 MED ORDER — SODIUM CHLORIDE 0.9 % IV BOLUS
2000.0000 mL | Freq: Once | INTRAVENOUS | Status: AC
Start: 2015-02-28 — End: 2015-02-28
  Administered 2015-02-28: 2000 mL via INTRAVENOUS

## 2015-02-28 MED ORDER — ONDANSETRON HCL 4 MG/2ML IJ SOLN
4.0000 mg | Freq: Three times a day (TID) | INTRAMUSCULAR | Status: AC | PRN
Start: 2015-02-28 — End: 2015-03-01

## 2015-02-28 NOTE — ED Notes (Signed)
Bed: PU31  Expected date: 02/28/15  Expected time: 4:52 PM  Means of arrival: [id number]- [place]  Comments:  [facility]

## 2015-02-28 NOTE — Plan of Care (Signed)
Problem: Health Promotion  Goal: Vaccination Screening  All patients will be screened for current vaccination status on each admission.   Outcome: Progressing  Pt refuse flu vaccination,will revisit again.  Goal: Knowledge - disease process  Extent of understanding conveyed about a specific disease process.   Outcome: Progressing  Pt arrived on unit,sleepy and refuse to have assessment done.Able to extend hands for vitals and barely answered questions.Pt restless on and off,used records to try and complete admission process.    Problem: Safety  Goal: Patient will be free from injury during hospitalization  Outcome: Progressing  Pt placed on seizure ,aspiration and fall precautions.Bed alarm activated and call bell within reach.Will do frequent rounding on pt     Problem: Pain  Goal: Patient's pain/discomfort is manageable  Outcome: Progressing  Pt unable to state if in pain,does not want any medication she stated.Will assess pt's comfort level.    Comments:   Face covered all the time when interacting,pt has elopement gown on.Will monitor.
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
## 2015-03-01 DIAGNOSIS — E876 Hypokalemia: Secondary | ICD-10-CM | POA: Diagnosis present

## 2015-03-01 DIAGNOSIS — F19921 Other psychoactive substance use, unspecified with intoxication with delirium: Secondary | ICD-10-CM | POA: Diagnosis present

## 2015-03-01 LAB — ECG 12-LEAD
Atrial Rate: 121 {beats}/min
P Axis: 40 degrees
P-R Interval: 144 ms
Q-T Interval: 332 ms
QRS Duration: 86 ms
QTC Calculation (Bezet): 471 ms
R Axis: 69 degrees
T Axis: 53 degrees
Ventricular Rate: 121 {beats}/min

## 2015-03-01 LAB — BASIC METABOLIC PANEL
Anion Gap: 7 (ref 5.0–15.0)
BUN: 4 mg/dL — ABNORMAL LOW (ref 7.0–19.0)
CO2: 23 mEq/L (ref 22–29)
Calcium: 8.5 mg/dL (ref 8.5–10.5)
Chloride: 108 mEq/L (ref 100–111)
Creatinine: 0.6 mg/dL (ref 0.6–1.0)
Glucose: 91 mg/dL (ref 70–100)
Potassium: 3.9 mEq/L (ref 3.5–5.1)
Sodium: 138 mEq/L (ref 136–145)

## 2015-03-01 LAB — COMPREHENSIVE METABOLIC PANEL
ALT: 18 U/L (ref 0–55)
AST (SGOT): 35 U/L — ABNORMAL HIGH (ref 5–34)
Albumin/Globulin Ratio: 1.4 (ref 0.9–2.2)
Albumin: 3.2 g/dL — ABNORMAL LOW (ref 3.5–5.0)
Alkaline Phosphatase: 52 U/L (ref 37–106)
Anion Gap: 9 (ref 5.0–15.0)
BUN: 4 mg/dL — ABNORMAL LOW (ref 7.0–19.0)
Bilirubin, Total: 0.8 mg/dL (ref 0.2–1.2)
CO2: 21 mEq/L — ABNORMAL LOW (ref 22–29)
Calcium: 8 mg/dL — ABNORMAL LOW (ref 8.5–10.5)
Chloride: 107 mEq/L (ref 100–111)
Creatinine: 0.7 mg/dL (ref 0.6–1.0)
Globulin: 2.3 g/dL (ref 2.0–3.6)
Glucose: 115 mg/dL — ABNORMAL HIGH (ref 70–100)
Potassium: 2.9 mEq/L — ABNORMAL LOW (ref 3.5–5.1)
Protein, Total: 5.5 g/dL — ABNORMAL LOW (ref 6.0–8.3)
Sodium: 137 mEq/L (ref 136–145)

## 2015-03-01 LAB — HEMOLYSIS INDEX
Hemolysis Index: 4 (ref 0–18)
Hemolysis Index: 5 (ref 0–18)
Hemolysis Index: 4 (ref 0–18)
Hemolysis Index: 4 (ref 0–18)

## 2015-03-01 LAB — CK: Creatine Kinase (CK): 2348 U/L — ABNORMAL HIGH (ref 29–168)

## 2015-03-01 LAB — GFR
EGFR: >60
EGFR: >60

## 2015-03-01 LAB — MAGNESIUM
Magnesium: 1.9 mg/dL (ref 1.6–2.6)
Magnesium: 1.8 mg/dL (ref 1.6–2.6)

## 2015-03-01 LAB — CKMB: Creatinine Kinase MB (CKMB): 3.6 ng/mL (ref 0.0–4.9)

## 2015-03-01 MED ORDER — POTASSIUM CHLORIDE 10 MEQ/100ML IV SOLN
10.0000 meq | INTRAVENOUS | Status: AC
Start: 2015-03-01 — End: 2015-03-01
  Administered 2015-03-01 (×2): 10 meq via INTRAVENOUS
  Filled 2015-03-01 (×2): qty 100

## 2015-03-01 MED ORDER — POTASSIUM CHLORIDE CRYS ER 20 MEQ PO TBCR
40.0000 meq | EXTENDED_RELEASE_TABLET | Freq: Once | ORAL | Status: AC
Start: 2015-03-01 — End: 2015-03-01
  Administered 2015-03-01: 40 meq via ORAL
  Filled 2015-03-01: qty 2

## 2015-03-01 MED ORDER — SODIUM CHLORIDE 0.9 % IV SOLN
INTRAVENOUS | Status: DC
Start: 2015-03-01 — End: 2015-03-02
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
## 2015-03-01 NOTE — Progress Notes (Signed)
Pt to start IVF after IV with vitamins is completed.

## 2015-03-01 NOTE — Progress Notes (Signed)
K 2.9. Spoke to Dr. Gaynelle Adu regarding this and ordered 2 runs of IV potassium and 1 oral tablet. To follow up with BMP after administering medications

## 2015-03-01 NOTE — Plan of Care (Signed)
Problem: Safety  Goal: Patient will be free from injury during hospitalization  Outcome: Progressing  AAOX4. VSS. Pt is at a low fall risk. Hospital safety protocols in place. Bed in lowest position, bed alarm on and brakes applied. Pt educated to use call bell and verbalized understanding. CIWA scale 0. IV fluids infusing. Pt reports no nausea or pain.    Intentional hourly rounding done to anticipate pt needs and continue assessment

## 2015-03-02 LAB — COMPREHENSIVE METABOLIC PANEL
ALT: 14 U/L (ref 0–55)
AST (SGOT): 27 U/L (ref 5–34)
Albumin/Globulin Ratio: 1.3 (ref 0.9–2.2)
Albumin: 3 g/dL — ABNORMAL LOW (ref 3.5–5.0)
Alkaline Phosphatase: 47 U/L (ref 37–106)
Anion Gap: 9 (ref 5.0–15.0)
BUN: 2 mg/dL — ABNORMAL LOW (ref 7.0–19.0)
Bilirubin, Total: 0.5 mg/dL (ref 0.2–1.2)
CO2: 21 mEq/L — ABNORMAL LOW (ref 22–29)
Calcium: 8.3 mg/dL — ABNORMAL LOW (ref 8.5–10.5)
Chloride: 109 mEq/L (ref 100–111)
Creatinine: 0.6 mg/dL (ref 0.6–1.0)
Globulin: 2.3 g/dL (ref 2.0–3.6)
Glucose: 75 mg/dL (ref 70–100)
Potassium: 3.5 mEq/L (ref 3.5–5.1)
Protein, Total: 5.3 g/dL — ABNORMAL LOW (ref 6.0–8.3)
Sodium: 139 mEq/L (ref 136–145)

## 2015-03-02 LAB — CK: Creatine Kinase (CK): 1173 U/L — ABNORMAL HIGH (ref 29–168)

## 2015-03-02 LAB — HEMOLYSIS INDEX: Hemolysis Index: 1 (ref 0–18)

## 2015-03-02 LAB — GFR: EGFR: >60

## 2015-03-02 LAB — CKMB: Creatinine Kinase MB (CKMB): 1.4 ng/mL (ref 0.0–4.9)

## 2015-03-02 MED ORDER — KETOROLAC TROMETHAMINE 30 MG/ML IJ SOLN
30.0000 mg | Freq: Once | INTRAMUSCULAR | Status: AC
Start: 2015-03-02 — End: 2015-03-02
  Administered 2015-03-02: 30 mg via INTRAVENOUS
  Filled 2015-03-02: qty 1

## 2015-03-02 NOTE — Discharge Instr - Activity (Signed)
As tolerated

## 2015-03-02 NOTE — Progress Notes (Signed)
SW met with pt at bedside to discuss discharge.  Pt has already [facility] [place] and they will transport pt to the shelter.  SW called [phone] and confirmed that they will accompany pt to the shelter today.  They will call me back in approximately 15 minutes to confirm time.  SW informed nurse to prepare pt for discharge.    Lourena Simmonds, MSW  x [phone]

## 2015-03-02 NOTE — Discharge Instructions (Signed)
Recovering from Addiction: Continuing with Counseling  The road to recovery can be tough. But working with a counselor can help make your recovery smoother and keep you on track. A counselor can help you decide which lifestyle changes you want to make to stay sober. Also, consider talking with a counselor about other issues you may want to work on. He or she can help you find resources for anger management, problem-solving skills, or assertiveness training.    Be aware of your triggers  Triggers are things that make you want to use again. They can be people you used with or places, things, and events that make you want to use. Stress and feelings like loneliness, anxiety, or depression can also make you want to use again. When you know what your triggers are, you can plan ways to avoid them when possible. To find your triggers, get a piece of paper. List the people, places, events, or feelings that could make you want to use again. Keep this paper. Add to it as needed. Once you have a full list, decide how to cope with these triggers without using.  Getting help  Once you admit that you have a substance abuse problem, there are many ways to find help.   Fish farm manager (EAP) or Journalist, newspaper.   Talk to your primary care doctor and ask for a referral to an addiction specialist for an evaluation.   Look in the white pages of your phone book for local chapters of these groups:   Alcoholics Anonymous   Cocaine Anonymous   Marijuana Anonymous   Narcotics Anonymous   SMART Recovery   Look in the yellow pages of your phone book under 1 of the following:   Alcoholism Information and Treatment Center   Drug Abuse and Addiction Information and Treatment Center   Look online for treatment centers near you:   Substance Abuse and Mental Health Services Administration's "treatment finder": http://findtreatment.RockToxic.pl   Contact 1 of these national groups:   [employer] on Alcoholism and Drug Dependence 907-355-9672   National Drug and Alcohol Treatment Referral Service 800-662-HELP 781-625-1658)   2000-2015 [employer] [address] [place], [place] [address]. All rights reserved. This information is not intended as a substitute for professional medical care. Always follow your healthcare professional's instructions.          Alcohol Addiction  Are you addicted to alcohol?    You may be addicted to alcohol if your drinking harms yourself or others or leads to other problems with your daily life.  The medical term for this is alcohol use disorder. Your health care provider may diagnose you with this disorder if you have at least two of the following problems within the span of a year:   You drink alcohol in larger amounts or for a longer period than you intended.   You frequently want to cut down or control alcohol use, or have frequently failed efforts to do so.   You spend a lot of time getting alcohol, using alcohol, or recovering from alcohol use.   You crave or have a strong desire or urge to drink alcohol.   Ongoing alcohol use makes it hard for you to be responsible at work, school, or home.   You continue to use alcohol even though you have had problems in relationships or social settings because of it.   You give up or miss important social, work, or recreational activities because  of alcohol use.   You drink alcohol in situations in which it is physically unsafe, such as driving while intoxicated.   You continue to drink alcohol despite knowing it has caused physical or emotional problems.   You need more and more alcohol to get the same effects.   You have withdrawal symptoms or use alcohol to avoid withdrawal symptoms.   [address] [employer] [address] [place], [place] [address]. All rights reserved. This information is not intended as a substitute for professional medical care. Always follow your healthcare  professional's instructions.          Recovering from Addiction: Coping with Relapse  Drug and alcohol abuse changes the brain in ways that continue long after the abuse ends. Because of this, people who are addicted to drugs or alcohol are at risk for relapse. This is true even after long periods of staying drug- or alcohol-free.Like other chronic diseases, substance addiction needs to be managed daily. A person who is addicted to drugs or alcohol needs a plan to help prevent, or manage, a relapse.  You will need ongoing treatment and support. Your best chance for long-term recovery will likely be a combination of medicines and behavioral therapy. Other tips include:  Stick with your treatment plan. It may seem like you've recovered and you don't need to keep taking steps to stay drug- or alcohol-free. Your chances of staying sober are much higher if you continue treatment after you recover.  Get help immediately if you use the drug again. If you start using again, talk with your health care provider or someone else who can help you right away.  Get loved ones involved in your recovery. A form of therapy called community reinforcement and family training focuses on counseling and training for your family. The therapist teaches your family how to help motivate you to seek treatment. This therapy also helps the family recognize situations that may lead you to drink or use drugs.Other family oriented recovery programs, such as Alcoholics Anonymous and Al-Anon, are available in communities nationwide.  Learn healthy ways to cope with stress, anger, boredom, or other triggers. Behavioral therapy can help you learn ways for coping with drug or alcohol cravings. It can also teach you ways to avoid drugs and prevent relapse, and help you deal with relapse if it occurs.   [address] [employer] [address] [place], [place] [address]. All rights reserved. This information is not intended as a substitute  for professional medical care. Always follow your healthcare professional's instructions.

## 2015-03-02 NOTE — Progress Notes (Signed)
Pt d/c to shelter. Written and verbal discharge instructions were given, verbalized understanding. IV removed. Left unit at 1200.

## 2015-03-02 NOTE — Discharge Instr - Diet (Signed)
Regular diet. No alcohol
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
## 2015-03-02 NOTE — Plan of Care (Addendum)
Problem: Safety  Goal: Patient will be free from injury during hospitalization  Outcome: Progressing  Patient alert and oriented x4, ambulatory, in bed, bed in low position, call bell within reach, no c/o of, nausea or vomiting, provided urine for UA, no respiratory distress, no c/o of SOB, or chest pain, CIWA scale 0, patient c/o of genarlized body pain MD notified received a order for Toradol 30mg  once IV.    Plan: Continue with hourly monitoring
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
## 2017-07-26 ENCOUNTER — Encounter: Payer: Self-pay | Admitting: Emergency Medicine

## 2017-07-26 ENCOUNTER — Emergency Department
Admission: EM | Admit: 2017-07-26 | Discharge: 2017-07-26 | Disposition: A | Discharge status: Home / Self Care | Payer: Medicaid Other | Attending: Emergency Medicine | Admitting: Emergency Medicine

## 2017-07-26 DIAGNOSIS — Z5321 Procedure and treatment not carried out due to patient leaving prior to being seen by health care provider: Secondary | ICD-10-CM | POA: Insufficient documentation

## 2017-07-26 DIAGNOSIS — R030 Elevated blood-pressure reading, without diagnosis of hypertension: Secondary | ICD-10-CM | POA: Diagnosis present

## 2017-07-26 LAB — CBC
HCT: 44.3 % (ref 35.0–47.0)
HEMOGLOBIN: 14.7 g/dL (ref 12.0–16.0)
MCH: 32.1 pg (ref 26.0–34.0)
MCHC: 33.3 g/dL (ref 32.0–36.0)
MCV: 96.5 fL (ref 80.0–100.0)
PLATELETS: 273 10*3/uL (ref 150–440)
RBC: 4.59 MIL/uL (ref 3.80–5.20)
RDW: 15.5 % — ABNORMAL HIGH (ref 11.5–14.5)
WBC: 10.3 10*3/uL (ref 3.6–11.0)

## 2017-07-26 LAB — BASIC METABOLIC PANEL
ANION GAP: 14 (ref 5–15)
BUN: 13 mg/dL (ref 6–20)
CALCIUM: 9.6 mg/dL (ref 8.9–10.3)
CO2: 24 mmol/L (ref 22–32)
CREATININE: 0.76 mg/dL (ref 0.44–1.00)
Chloride: 99 mmol/L — ABNORMAL LOW (ref 101–111)
Glucose, Bld: 109 mg/dL — ABNORMAL HIGH (ref 65–99)
Potassium: 3.9 mmol/L (ref 3.5–5.1)
SODIUM: 137 mmol/L (ref 135–145)

## 2017-07-26 LAB — ETHANOL: ALCOHOL ETHYL (B): 342 mg/dL — AB (ref <5)

## 2017-07-26 LAB — URINE DRUG SCREEN, QUALITATIVE (ARMC ONLY)
AMPHETAMINES, UR SCREEN: NOT DETECTED
BARBITURATES, UR SCREEN: NOT DETECTED
BENZODIAZEPINE, UR SCRN: NOT DETECTED
Cannabinoid 50 Ng, Ur ~~LOC~~: NOT DETECTED
Cocaine Metabolite,Ur ~~LOC~~: NOT DETECTED
MDMA (Ecstasy)Ur Screen: NOT DETECTED
Methadone Scn, Ur: NOT DETECTED
OPIATE, UR SCREEN: NOT DETECTED
Phencyclidine (PCP) Ur S: NOT DETECTED
TRICYCLIC, UR SCREEN: NOT DETECTED

## 2017-07-26 LAB — TROPONIN I
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
## 2017-07-26 NOTE — ED Notes (Signed)
Pt noted walking out of ED lobby
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
## 2017-07-26 NOTE — ED Notes (Signed)
Pt not found in lobby 
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
## 2017-07-27 LAB — POCT PREGNANCY, URINE: PREG TEST UR: NEGATIVE

## 2017-11-11 ENCOUNTER — Other Ambulatory Visit: Payer: Self-pay

## 2017-11-11 ENCOUNTER — Encounter: Payer: Self-pay | Admitting: Emergency Medicine

## 2017-11-11 ENCOUNTER — Emergency Department
Admission: EM | Admit: 2017-11-11 | Discharge: 2017-11-11 | Disposition: A | Discharge status: Home / Self Care | Payer: Medicaid Other | Attending: Emergency Medicine | Admitting: Emergency Medicine

## 2017-11-11 ENCOUNTER — Emergency Department: Payer: Medicaid Other

## 2017-11-11 DIAGNOSIS — I10 Essential (primary) hypertension: Secondary | ICD-10-CM | POA: Diagnosis not present

## 2017-11-11 DIAGNOSIS — R112 Nausea with vomiting, unspecified: Secondary | ICD-10-CM | POA: Insufficient documentation

## 2017-11-11 DIAGNOSIS — R05 Cough: Secondary | ICD-10-CM | POA: Diagnosis present

## 2017-11-11 DIAGNOSIS — Z87891 Personal history of nicotine dependence: Secondary | ICD-10-CM | POA: Insufficient documentation

## 2017-11-11 HISTORY — DX: Essential (primary) hypertension: I10

## 2017-11-11 LAB — URINALYSIS, COMPLETE (UACMP) WITH MICROSCOPIC
BILIRUBIN URINE: NEGATIVE
Glucose, UA: NEGATIVE mg/dL
Ketones, ur: 80 mg/dL — AB
LEUKOCYTES UA: NEGATIVE
NITRITE: NEGATIVE
PH: 6 (ref 5.0–8.0)
Protein, ur: NEGATIVE mg/dL
SPECIFIC GRAVITY, URINE: 1.008 (ref 1.005–1.030)

## 2017-11-11 LAB — COMPREHENSIVE METABOLIC PANEL
ALT: 24 U/L (ref 14–54)
AST: 43 U/L — AB (ref 15–41)
Albumin: 4.3 g/dL (ref 3.5–5.0)
Alkaline Phosphatase: 97 U/L (ref 38–126)
Anion gap: 17 — ABNORMAL HIGH (ref 5–15)
BILIRUBIN TOTAL: 1.6 mg/dL — AB (ref 0.3–1.2)
CO2: 20 mmol/L — ABNORMAL LOW (ref 22–32)
Calcium: 8.9 mg/dL (ref 8.9–10.3)
Chloride: 92 mmol/L — ABNORMAL LOW (ref 101–111)
Creatinine, Ser: 0.62 mg/dL (ref 0.44–1.00)
GFR calc Af Amer: >60 mL/min (ref >60)
Glucose, Bld: 74 mg/dL (ref 65–99)
Potassium: 3.2 mmol/L — ABNORMAL LOW (ref 3.5–5.1)
Sodium: 129 mmol/L — ABNORMAL LOW (ref 135–145)
TOTAL PROTEIN: 8.4 g/dL — AB (ref 6.5–8.1)

## 2017-11-11 LAB — CBC
HCT: 43.1 % (ref 35.0–47.0)
Hemoglobin: 14.7 g/dL (ref 12.0–16.0)
MCH: 32.5 pg (ref 26.0–34.0)
MCHC: 34.2 g/dL (ref 32.0–36.0)
MCV: 95.2 fL (ref 80.0–100.0)
PLATELETS: 170 10*3/uL (ref 150–440)
RBC: 4.53 MIL/uL (ref 3.80–5.20)
RDW: 17 % — AB (ref 11.5–14.5)
WBC: 3.4 10*3/uL — AB (ref 3.6–11.0)

## 2017-11-11 LAB — LIPASE, BLOOD: Lipase: 21 U/L (ref 11–51)

## 2017-11-11 LAB — POCT PREGNANCY, URINE: Preg Test, Ur: NEGATIVE

## 2017-11-11 MED ORDER — LISINOPRIL 20 MG PO TABS: 20.0000 mg | ORAL_TABLET | Freq: Every day | ORAL | 1 refills | Status: AC

## 2017-11-11 MED ORDER — SODIUM CHLORIDE 0.9 % IV SOLN
Freq: Once | INTRAVENOUS | Status: AC
  Administered 2017-11-11: 13:00:00 via INTRAVENOUS

## 2017-11-11 MED ORDER — LANSOPRAZOLE 30 MG PO CPDR: 30.0000 mg | DELAYED_RELEASE_CAPSULE | Freq: Every day | ORAL | 1 refills | Status: AC

## 2017-11-11 MED ORDER — ONDANSETRON HCL 4 MG/2ML IJ SOLN
4.0000 mg | Freq: Once | INTRAMUSCULAR | Status: AC
  Administered 2017-11-11: 4 mg via INTRAVENOUS
  Filled 2017-11-11: qty 2

## 2017-11-11 MED ORDER — ONDANSETRON 4 MG PO TBDP: 4.0000 mg | ORAL_TABLET | Freq: Three times a day (TID) | ORAL | 0 refills | Status: AC | PRN

## 2017-11-11 NOTE — ED Notes (Signed)
Returned from XR 

## 2017-11-11 NOTE — ED Notes (Signed)
Patient transported to X-ray 

## 2017-11-11 NOTE — ED Triage Notes (Addendum)
States cough and nausea and vomiting x 2 days. States does not cough til she vomits, that vomiting is unrelated to cough.  Mild abdominal pain. Regarding blood pressure, states has not been able to keep blood pressure medications down.

## 2017-11-11 NOTE — ED Notes (Signed)
Dr. Mayford KnifeWilliams already aware of patient's elevated BP.  New RX for Lisinopril given.

## 2017-11-11 NOTE — ED Provider Notes (Addendum)
[facility] Emergency Department Provider Note       Time seen: ----------------------------------------- 12:54 PM on 11/11/2017 -----------------------------------------   I have reviewed the triage vital signs and the nursing notes.  HISTORY   Chief Complaint Cough and Emesis    HPI Ashley Potts is a 36 y.o. female with a history of hypertension who presents to the ED for cough with nausea vomiting for 2 days.  Patient states she does cough until she vomits at times.  She has mild abdominal pain and states she is having vaginal bleeding but she recently finished her menstrual cycle.  She reports she has not been able to take her blood pressure medicines because of the vomiting.  She denies fevers, chills or other complaints.  Past Medical History:  Diagnosis Date  . Hypertension     There are no active problems to display for this patient.   Past Surgical History:  Procedure Laterality Date  . ANKLE SURGERY      Allergies Penicillins  Social History Social History   Tobacco Use  . Smoking status: Former Smoker    Packs/day: 0.10    Types: Cigarettes  . Smokeless tobacco: Never Used  Substance Use Topics  . Alcohol use: Yes  . Drug use: No    Review of Systems Constitutional: Negative for fever. Eyes: Negative for vision changes ENT:  Negative for congestion, sore throat Cardiovascular: Negative for chest pain. Respiratory: Positive for cough Gastrointestinal: Positive for abdominal pain and vomiting Genitourinary: Negative for dysuria. Musculoskeletal: Negative for back pain. Skin: Negative for rash. Neurological: Negative for headaches, focal weakness or numbness.  All systems negative/normal/unremarkable except as stated in the HPI  ____________________________________________   PHYSICAL EXAM:  VITAL SIGNS: ED Triage Vitals  Enc Vitals Group     BP 11/11/17 1233 (!) 189/120     Pulse Rate 11/11/17 1233 96      Resp 11/11/17 1233 20     Temp 11/11/17 1233 98.2 F (36.8 C)     Temp Source 11/11/17 1233 Oral     SpO2 11/11/17 1233 100 %     Weight 11/11/17 1236 132 lb (59.9 kg)     Height 11/11/17 1236 5\' 11"  (1.803 m)     Head Circumference --      Peak Flow --      Pain Score 11/11/17 1233 8     Pain Loc --      Pain Edu? --      Excl. in GC? --     Constitutional: Alert and oriented. Well appearing and in no distress. Eyes: Conjunctivae are normal. Normal extraocular movements. ENT   Head: Normocephalic and atraumatic.   Nose: No congestion/rhinnorhea.   Mouth/Throat: Mucous membranes are moist.   Neck: No stridor. Cardiovascular: Normal rate, regular rhythm. No murmurs, rubs, or gallops. Respiratory: Normal respiratory effort without tachypnea nor retractions. Breath sounds are clear and equal bilaterally. No wheezes/rales/rhonchi. Gastrointestinal: Soft and nontender. Normal bowel sounds Musculoskeletal: Nontender with normal range of motion in extremities. No lower extremity tenderness nor edema. Neurologic:  Normal speech and language. No gross focal neurologic deficits are appreciated.  Skin:  Skin is warm, dry and intact. No rash noted. Psychiatric: Mood and affect are normal. Speech and behavior are normal.  ____________________________________________  ED COURSE:  Pertinent labs & imaging results that were available during my care of the patient were reviewed by me and considered in my medical decision making (see chart for details). Patient presents for multiple  symptoms including cough with nausea and vomiting, we will assess with labs and imaging as indicated. Clinical Course as of Nov 11 1450  Sat Nov 11, 2017  1439 Patient admits to drinking alcohol at least every other day  [JW]    Clinical Course User Index [JW] Emily FilbertWilliams, Anahid Eskelson E, MD   Procedures ____________________________________________   LABS (pertinent positives/negatives)  Labs Reviewed   COMPREHENSIVE METABOLIC PANEL - Abnormal; Notable for the following components:      Result Value   Sodium 129 (*)    Potassium 3.2 (*)    Chloride 92 (*)    CO2 20 (*)    BUN <5 (*)    Total Protein 8.4 (*)    AST 43 (*)    Total Bilirubin 1.6 (*)    Anion gap 17 (*)    All other components within normal limits  CBC - Abnormal; Notable for the following components:   WBC 3.4 (*)    RDW 17.0 (*)    All other components within normal limits  URINALYSIS, COMPLETE (UACMP) WITH MICROSCOPIC - Abnormal; Notable for the following components:   Color, Urine STRAW (*)    APPearance CLEAR (*)    Hgb urine dipstick MODERATE (*)    Ketones, ur 80 (*)    Bacteria, UA RARE (*)    Squamous Epithelial / LPF 0-5 (*)    All other components within normal limits  LIPASE, BLOOD  POCT PREGNANCY, URINE  POC URINE PREG, ED    RADIOLOGY Images were viewed by me  Chest x-ray IMPRESSION: No active cardiopulmonary disease. ____________________________________________  DIFFERENTIAL DIAGNOSIS   Viral syndrome, gastroenteritis, dehydration, electrolyte abnormality, pregnancy, appendicitis, renal colic  FINAL ASSESSMENT AND PLAN  Cough, vomiting   Plan: Patient had presented for cough as well as nausea and vomiting. Patient's labs did reveal some abnormalities including mildly elevated anion gap and AST. Patient's imaging was negative for acute process.  She does have alcohol use disorder and have advised her to quit drinking.  She also will be advised to stop taking HCTZ because she has low sodium and chloride levels.  I will write for lisinopril by itself.  Will prescribe antiemetics for her and overall she is stable for outpatient follow-up.   Emily FilbertWilliams, Raetta Agostinelli E, MD   Note: This note was generated in part or whole with voice recognition software. Voice recognition is usually quite accurate but there are transcription errors that can and very often do occur. I apologize for any  typographical errors that were not detected and corrected.     Emily FilbertWilliams, Jemiah Cuadra E, MD 11/11/17 1452    Emily FilbertWilliams, Tineshia Becraft E, MD 11/11/17 [phone]

## 2018-01-04 ENCOUNTER — Encounter: Payer: Self-pay | Admitting: Emergency Medicine

## 2018-01-04 ENCOUNTER — Other Ambulatory Visit: Payer: Self-pay

## 2018-01-04 ENCOUNTER — Emergency Department
Admission: EM | Admit: 2018-01-04 | Discharge: 2018-01-05 | Disposition: A | Discharge status: Home / Self Care | Payer: Medicaid Other | Attending: Emergency Medicine | Admitting: Emergency Medicine

## 2018-01-04 DIAGNOSIS — Z87891 Personal history of nicotine dependence: Secondary | ICD-10-CM | POA: Diagnosis not present

## 2018-01-04 DIAGNOSIS — Z79899 Other long term (current) drug therapy: Secondary | ICD-10-CM | POA: Diagnosis not present

## 2018-01-04 DIAGNOSIS — F99 Mental disorder, not otherwise specified: Secondary | ICD-10-CM | POA: Diagnosis present

## 2018-01-04 DIAGNOSIS — F1994 Other psychoactive substance use, unspecified with psychoactive substance-induced mood disorder: Secondary | ICD-10-CM

## 2018-01-04 DIAGNOSIS — F1014 Alcohol abuse with alcohol-induced mood disorder: Secondary | ICD-10-CM | POA: Diagnosis not present

## 2018-01-04 DIAGNOSIS — I10 Essential (primary) hypertension: Secondary | ICD-10-CM | POA: Insufficient documentation

## 2018-01-04 DIAGNOSIS — F10929 Alcohol use, unspecified with intoxication, unspecified: Secondary | ICD-10-CM

## 2018-01-04 DIAGNOSIS — F10129 Alcohol abuse with intoxication, unspecified: Secondary | ICD-10-CM

## 2018-01-04 LAB — COMPREHENSIVE METABOLIC PANEL
ALT: 12 U/L — AB (ref 14–54)
AST: 23 U/L (ref 15–41)
Albumin: 4 g/dL (ref 3.5–5.0)
Alkaline Phosphatase: 65 U/L (ref 38–126)
Anion gap: 11 (ref 5–15)
BUN: 9 mg/dL (ref 6–20)
CALCIUM: 8.5 mg/dL — AB (ref 8.9–10.3)
CHLORIDE: 105 mmol/L (ref 101–111)
CO2: 23 mmol/L (ref 22–32)
CREATININE: 0.47 mg/dL (ref 0.44–1.00)
Glucose, Bld: 104 mg/dL — ABNORMAL HIGH (ref 65–99)
Potassium: 3.3 mmol/L — ABNORMAL LOW (ref 3.5–5.1)
Sodium: 139 mmol/L (ref 135–145)
TOTAL PROTEIN: 7.2 g/dL (ref 6.5–8.1)
Total Bilirubin: 0.6 mg/dL (ref 0.3–1.2)

## 2018-01-04 LAB — URINE DRUG SCREEN, QUALITATIVE (ARMC ONLY)
Amphetamines, Ur Screen: NOT DETECTED
BENZODIAZEPINE, UR SCRN: NOT DETECTED
Barbiturates, Ur Screen: NOT DETECTED
CANNABINOID 50 NG, UR ~~LOC~~: NOT DETECTED
Cocaine Metabolite,Ur ~~LOC~~: NOT DETECTED
MDMA (Ecstasy)Ur Screen: NOT DETECTED
Methadone Scn, Ur: NOT DETECTED
OPIATE, UR SCREEN: NOT DETECTED
PHENCYCLIDINE (PCP) UR S: NOT DETECTED
Tricyclic, Ur Screen: NOT DETECTED

## 2018-01-04 LAB — CBC WITH DIFFERENTIAL/PLATELET
BASOS ABS: 0.1 10*3/uL (ref 0–0.1)
BASOS PCT: 1 %
EOS ABS: 0.1 10*3/uL (ref 0–0.7)
Eosinophils Relative: 2 %
HCT: 40.5 % (ref 35.0–47.0)
Hemoglobin: 14 g/dL (ref 12.0–16.0)
Lymphocytes Relative: 32 %
Lymphs Abs: 2 10*3/uL (ref 1.0–3.6)
MCH: 33.6 pg (ref 26.0–34.0)
MCHC: 34.5 g/dL (ref 32.0–36.0)
MCV: 97.4 fL (ref 80.0–100.0)
Monocytes Absolute: 0.5 10*3/uL (ref 0.2–0.9)
Monocytes Relative: 8 %
NEUTROS PCT: 57 %
Neutro Abs: 3.6 10*3/uL (ref 1.4–6.5)
Platelets: 199 10*3/uL (ref 150–440)
RBC: 4.15 MIL/uL (ref 3.80–5.20)
RDW: 20.3 % — ABNORMAL HIGH (ref 11.5–14.5)
WBC: 6.2 10*3/uL (ref 3.6–11.0)

## 2018-01-04 LAB — URINALYSIS, COMPLETE (UACMP) WITH MICROSCOPIC
Bacteria, UA: NONE SEEN
Bilirubin Urine: NEGATIVE
GLUCOSE, UA: NEGATIVE mg/dL
KETONES UR: NEGATIVE mg/dL
LEUKOCYTES UA: NEGATIVE
Nitrite: NEGATIVE
PROTEIN: NEGATIVE mg/dL
Specific Gravity, Urine: 1.002 — ABNORMAL LOW (ref 1.005–1.030)
pH: 6 (ref 5.0–8.0)

## 2018-01-04 LAB — ACETAMINOPHEN LEVEL: Acetaminophen (Tylenol), Serum: <10 ug/mL — ABNORMAL LOW (ref 10–30)

## 2018-01-04 LAB — LIPASE, BLOOD: LIPASE: 37 U/L (ref 11–51)

## 2018-01-04 LAB — PREGNANCY, URINE: PREG TEST UR: NEGATIVE

## 2018-01-04 LAB — ETHANOL: ALCOHOL ETHYL (B): 405 mg/dL — AB (ref <10)

## 2018-01-04 MED ORDER — LORAZEPAM 2 MG/ML IJ SOLN
INTRAMUSCULAR | Status: AC
  Filled 2018-01-04: qty 1

## 2018-01-04 MED ORDER — LORAZEPAM 2 MG/ML IJ SOLN
2.0000 mg | Freq: Once | INTRAMUSCULAR | Status: AC
  Administered 2018-01-04: 2 mg via INTRAMUSCULAR

## 2018-01-04 MED ORDER — HALOPERIDOL LACTATE 5 MG/ML IJ SOLN
5.0000 mg | Freq: Once | INTRAMUSCULAR | Status: AC
  Administered 2018-01-04: 5 mg via INTRAMUSCULAR

## 2018-01-04 MED ORDER — HALOPERIDOL LACTATE 5 MG/ML IJ SOLN
INTRAMUSCULAR | Status: AC
  Administered 2018-01-04: 5 mg via INTRAMUSCULAR
  Filled 2018-01-04: qty 1

## 2018-01-04 MED ORDER — LORAZEPAM 2 MG/ML IJ SOLN: 2.0000 mg | Freq: Once | INTRAMUSCULAR | Status: DC

## 2018-01-04 MED ORDER — SODIUM CHLORIDE 0.9 % IV BOLUS (SEPSIS): 1000.0000 mL | Freq: Once | INTRAVENOUS | Status: DC
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
## 2018-01-04 NOTE — ED Notes (Signed)
Pts daughter given meal provided by [employer] officer. Sitter in place with pt and pts child in room for safety concerns for both parties.

## 2018-01-04 NOTE — ED Notes (Signed)
Pt currently lying in bed asleep, with no distress. Pt vitals being monitored. This EDT is at bedside 1:1, environment is safe.  Nothing needed from staff at this time.

## 2018-01-04 NOTE — ED Triage Notes (Signed)
Pt presents to ED from home by EMS accompanied by 36 year old daughter. Pt states she called EMS with c/o "not feeling well". Pt states she was vomiting for the past 2 days with lower abd pain. Pt reports pain with urination. Also states her blood pressure is "messed up" and is making her have a headache. Has been taking her blood pressure medication as prescribed per pt. [employer] on scene and pt was said to be uncooperative and altered. Possible ETOH.
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
## 2018-01-04 NOTE — ED Notes (Signed)
Pt continuing to yell in room, throwing her hands out at staff and threatening to leave. Pt advised that for her safety that IVC papers had been taken out and that for her child's concern of well being that DSS had been contacted. Pt threatening this RN. Pt sts, "Bitch I am going to show you. You stupid bitch, I will fuck you up. You are nothing. You are shit. Get the fuck out. I aint dealing with you. You fucking bitch. I swear Im going to fuck you up." Pt advised to calm down and is still in refusal. ODS, BPD, MD, RN x2, tech x1 in room for medical intervention. [facility]

## 2018-01-05 DIAGNOSIS — F1994 Other psychoactive substance use, unspecified with psychoactive substance-induced mood disorder: Secondary | ICD-10-CM

## 2018-01-05 DIAGNOSIS — F10929 Alcohol use, unspecified with intoxication, unspecified: Secondary | ICD-10-CM

## 2018-01-05 DIAGNOSIS — F10129 Alcohol abuse with intoxication, unspecified: Secondary | ICD-10-CM

## 2018-01-05 MED ORDER — AMLODIPINE BESYLATE 5 MG PO TABS
5.0000 mg | ORAL_TABLET | Freq: Once | ORAL | Status: AC
  Administered 2018-01-05: 5 mg via ORAL
  Filled 2018-01-05: qty 1

## 2018-01-05 MED ORDER — HYDROCHLOROTHIAZIDE 25 MG PO TABS
25.0000 mg | ORAL_TABLET | Freq: Every day | ORAL | Status: DC
  Administered 2018-01-05: 25 mg via ORAL
  Filled 2018-01-05: qty 1

## 2018-01-05 NOTE — ED Notes (Signed)
Pt now awake stating that she wants to leave. Pt initally asked about her daughters whereabouts and was informed that she is safe. RN notified to mediate the situation. Pt was informed that based on recent events she has been IVC'd. Pt unclear as to why this has occurred yet states that she does remember all of the events that lead up to this moment now stating " I was just drinking to help with my pain. I was at a party... No I don't do this a lot but Im grown and as long as I'm paying my own bills I can do whatever the fuck I want, you know what I'm saying?" Pt still moderately cooperative and redirectable at this time with increased visible agitation. RN notified MD that pt is requesting additional information regarding this process and why she cannot leave. Pt now lying in bed with eyes closed.

## 2018-01-05 NOTE — ED Notes (Signed)
DSS here to speak with patient.   

## 2018-01-05 NOTE — ED Notes (Signed)
Pt currently lying in bed asleep, with no distress. Pt vitals being monitored. This EDT is at bedside 1:1, environment is safe. Nothing needed form staff at this time.

## 2018-01-05 NOTE — BH Assessment (Signed)
This Clinical research associatewriter received call from [place] DSS Social Worker, McBeeNakeisha in regards to patients disposition. TTS informed Delana Meyerakeisha patient is pending psy consult and will follow up once disposition has been given. Delana Meyerakeisha can be reached at [phone].

## 2018-01-05 NOTE — ED Notes (Signed)
Family Member: Celine Ahr(Aunt) Darrick MeigsMichelle Auston [phone]  Here with DSS, Misty StanleyLisa to take pts daughter, Selinda FlavinJohanna Deininger, to care for until pt released and DSS see fits.

## 2018-01-05 NOTE — ED Notes (Signed)
Dr Clapacs rescinded IVC papers. 

## 2018-01-05 NOTE — ED Notes (Signed)
[facility] to [facility] 6

## 2018-01-05 NOTE — ED Notes (Signed)
IVC  PAPERS  RESCINDED  PER  DR  CLAPACS  MD  INFORMED  DR  PADUCHOWSKI  MD AND  BHU NURSE  WENDY  NEAL

## 2018-01-05 NOTE — Discharge Instructions (Signed)
You have been seen in the emergency department for a  psychiatric concern. You have been evaluated both medically as well as psychiatrically. Please follow-up with your outpatient resources provided. Return to the emergency department for any worsening symptoms, or any thoughts of hurting yourself or anyone else so that we may attempt to help you. 

## 2018-01-05 NOTE — ED Notes (Signed)
Pt taken to BHU

## 2018-01-05 NOTE — Consult Note (Signed)
[facility] Psychiatry Consult   Reason for Consult: Consult for 37 year old woman who presented to the emergency room last night agitated and intoxicated Referring Physician: [facility] Patient Identification: Ashley Potts MRN:  [id number] Principal Diagnosis: Substance induced mood disorder ([place]) Diagnosis:   Patient Active Problem List   Diagnosis Date Noted  . Substance induced mood disorder ([place]) [F19.94] 01/05/2018    Priority: High  . Alcohol intoxication [facility]) [F10.929] 01/05/2018    Priority: Medium    Total Time spent with patient: 1 hour  Subjective:   Ashley Potts is a 37 y.o. female patient admitted with "I do not know why I am here".  HPI: Patient interviewed.  Chart reviewed.  Patient presented to the emergency room last night initially with physical complaints.  She had her 59-year-old daughter with her at the time.  Reportedly she was agitated and belligerent.  She was found to be intoxicated.  Patient had been kept in the hospital overnight because of this.  Patient denies having any suicidal or homicidal thoughts.  Admits that she was intoxicated yesterday.  Said she and her girlfriends were doing shots yesterday which is more than she usually drinks.  Patient says she drinks a couple times a week.  She denies feeling like her alcohol has ever been a problem for her.  Admits occasional use of marijuana denies other drug use.  Patient denies any mood symptoms.  Denies any psychotic symptoms.  Denies any subjective symptoms of alcohol withdrawal.  Social history: Patient says she lives by herself with her 7-year daughter and that she is employed.  Medical history: Evidently she has a history of high blood pressure and is supposed to be on medicine.  Her blood pressure was her chief complaint when she first presented.  Substance abuse history: Patient admits to frequent use of alcohol but denies any history of seizures or DTs.  She does not believe alcohol  abuse is a problem for her.  She says she has been able to stop for years at a time.  Denies being involved in any substance abuse treatment  Past Psychiatric History: Denies any past history other than seeing a supportive counselor after her brother was murdered.  Has never been in a psychiatric hospital.  No history of suicide attempts.  Risk to Self: Is patient at risk for suicide?: No Risk to Others:   Prior Inpatient Therapy:   Prior Outpatient Therapy:    Past Medical History:  Past Medical History:  Diagnosis Date  . Hypertension     Past Surgical History:  Procedure Laterality Date  . ANKLE SURGERY     Family History: No family history on file. Family Psychiatric  History: Alcohol abuse in the family Social History:  Social History   Substance and Sexual Activity  Alcohol Use Yes     Social History   Substance and Sexual Activity  Drug Use No    Social History   Socioeconomic History  . Marital status: Single    Spouse name: None  . Number of children: None  . Years of education: None  . Highest education level: None  Social Needs  . Financial resource strain: None  . Food insecurity - worry: None  . Food insecurity - inability: None  . Transportation needs - medical: None  . Transportation needs - non-medical: None  Occupational History  . None  Tobacco Use  . Smoking status: Former Smoker    Packs/day: 1.00    Types: Cigarettes  . Smokeless  tobacco: Never Used  Substance and Sexual Activity  . Alcohol use: Yes  . Drug use: No  . Sexual activity: None  Other Topics Concern  . None  Social History Narrative  . None   Additional Social History:    Allergies:   Allergies  Allergen Reactions  . Lisinopril Swelling and Rash  . Penicillins Rash    Labs:  Results for orders placed or performed during the hospital encounter of 01/04/18 (from the past 48 hour(s))  Ethanol     Status: Abnormal   Collection Time: 01/04/18  7:56 PM  Result Value  Ref Range   Alcohol, Ethyl (B) 405 (HH) <10 mg/dL    Comment: CRITICAL RESULT CALLED TO, READ BACK BY AND VERIFIED WITH:        LOWEST DETECTABLE LIMIT FOR SERUM ALCOHOL IS 10 mg/dL FOR MEDICAL PURPOSES ONLY CALLED TO ANDREA BRYANT @ 2037 ON 01/04/2018 BY CAF Performed at [facility], [address] [place], [place] [address]   Comprehensive metabolic panel     Status: Abnormal   Collection Time: 01/04/18  7:56 PM  Result Value Ref Range   Sodium 139 135 - 145 mmol/L   Potassium 3.3 (L) 3.5 - 5.1 mmol/L   Chloride 105 101 - 111 mmol/L   CO2 23 22 - 32 mmol/L   Glucose, Bld 104 (H) 65 - 99 mg/dL   BUN 9 6 - 20 mg/dL   Creatinine, Ser 0.47 0.44 - 1.00 mg/dL   Calcium 8.5 (L) 8.9 - 10.3 mg/dL   Total Protein 7.2 6.5 - 8.1 g/dL   Albumin 4.0 3.5 - 5.0 g/dL   AST 23 15 - 41 U/L   ALT 12 (L) 14 - 54 U/L   Alkaline Phosphatase 65 38 - 126 U/L   Total Bilirubin 0.6 0.3 - 1.2 mg/dL   GFR calc non Af Amer >60 >60 mL/min   GFR calc Af Amer >60 >60 mL/min    Comment: (NOTE) The eGFR has been calculated using the CKD EPI equation. This calculation has not been validated in all clinical situations. eGFR's persistently <60 mL/min signify possible Chronic Kidney Disease.    Anion gap 11 5 - 15    Comment: Performed at [facility], [address] [place],  [address]  CBC with Differential     Status: Abnormal   Collection Time: 01/04/18  7:56 PM  Result Value Ref Range   WBC 6.2 3.6 - 11.0 K/uL   RBC 4.15 3.80 - 5.20 MIL/uL   Hemoglobin 14.0 12.0 - 16.0 g/dL   HCT 40.5 35.0 - 47.0 %   MCV 97.4 80.0 - 100.0 fL   MCH 33.6 26.0 - 34.0 pg   MCHC 34.5 32.0 - 36.0 g/dL   RDW 20.3 (H) 11.5 - 14.5 %   Platelets 199 150 - 440 K/uL   Neutrophils Relative % 57 %   Neutro Abs 3.6 1.4 - 6.5 K/uL   Lymphocytes Relative 32 %   Lymphs Abs 2.0 1.0 - 3.6 K/uL   Monocytes Relative 8 %   Monocytes Absolute 0.5 0.2 - 0.9 K/uL   Eosinophils Relative 2 %   Eosinophils  Absolute 0.1 0 - 0.7 K/uL   Basophils Relative 1 %   Basophils Absolute 0.1 0 - 0.1 K/uL    Comment: Performed at [facility], [address] [place],  [address]  Pregnancy, urine     Status: None   Collection Time: 01/04/18  7:56 PM  Result Value Ref Range   Preg Test, Ur NEGATIVE NEGATIVE    Comment: Performed at [facility], [address] [place], [place] [address]  Urinalysis, Complete w Microscopic     Status: Abnormal   Collection Time: 01/04/18  7:56 PM  Result Value Ref Range   Color, Urine STRAW (A) YELLOW   APPearance CLEAR (A) CLEAR   Specific Gravity, Urine 1.002 (L) 1.005 - 1.030   pH 6.0 5.0 - 8.0   Glucose, UA NEGATIVE NEGATIVE mg/dL   Hgb urine dipstick SMALL (A) NEGATIVE   Bilirubin Urine NEGATIVE NEGATIVE   Ketones, ur NEGATIVE NEGATIVE mg/dL   Protein, ur NEGATIVE NEGATIVE mg/dL   Nitrite NEGATIVE NEGATIVE   Leukocytes, UA NEGATIVE NEGATIVE   RBC / HPF 0-5 0 - 5 RBC/hpf   WBC, UA 0-5 0 - 5 WBC/hpf   Bacteria, UA NONE SEEN NONE SEEN   Squamous Epithelial / LPF 0-5 (A) NONE SEEN    Comment: Performed at [facility], [address], [place], [place] [address]  Urine Drug Screen, Qualitative     Status: None   Collection Time: 01/04/18  7:56 PM  Result Value Ref Range   Tricyclic, Ur Screen NONE DETECTED NONE DETECTED   Amphetamines, Ur Screen NONE DETECTED NONE DETECTED   MDMA (Ecstasy)Ur Screen NONE DETECTED NONE DETECTED   Cocaine Metabolite,Ur [place] NONE DETECTED NONE DETECTED   Opiate, Ur Screen NONE DETECTED NONE DETECTED   Phencyclidine (PCP) Ur S NONE DETECTED NONE DETECTED   Cannabinoid 50 Ng, Ur [place] NONE DETECTED NONE DETECTED   Barbiturates, Ur Screen NONE DETECTED NONE DETECTED   Benzodiazepine, Ur Scrn NONE DETECTED NONE DETECTED   Methadone Scn, Ur NONE DETECTED NONE DETECTED    Comment: (NOTE) Tricyclics + metabolites, urine    Cutoff 1000 ng/mL Amphetamines + metabolites, urine  Cutoff 1000 ng/mL MDMA  (Ecstasy), urine              Cutoff 500 ng/mL Cocaine Metabolite, urine          Cutoff 300 ng/mL Opiate + metabolites, urine        Cutoff 300 ng/mL Phencyclidine (PCP), urine         Cutoff 25 ng/mL Cannabinoid, urine                 Cutoff 50 ng/mL Barbiturates + metabolites, urine  Cutoff 200 ng/mL Benzodiazepine, urine              Cutoff 200 ng/mL Methadone, urine                   Cutoff 300 ng/mL The urine drug screen provides only a preliminary, unconfirmed analytical test result and should not be used for non-medical purposes. Clinical consideration and professional judgment should be applied to any positive drug screen result due to possible interfering substances. A more specific alternate chemical method must be used in order to obtain a confirmed analytical result. Gas chromatography / mass spectrometry (GC/MS) is the preferred confirmat ory method. Performed at [facility], [address] [place], [place] [address]   Lipase, blood     Status: None   Collection Time: 01/04/18  7:56 PM  Result Value Ref Range   Lipase 37 11 - 51 U/L    Comment: Performed at [facility], [address] [place], [place] [address]  Acetaminophen level     Status: Abnormal   Collection Time: 01/04/18  7:56 PM  Result Value  Ref Range   Acetaminophen (Tylenol), Serum <10 (L) 10 - 30 ug/mL    Comment:        THERAPEUTIC CONCENTRATIONS VARY SIGNIFICANTLY. A RANGE OF 10-30 ug/mL MAY BE AN EFFECTIVE CONCENTRATION FOR MANY PATIENTS. HOWEVER, SOME ARE BEST TREATED AT CONCENTRATIONS OUTSIDE THIS RANGE. ACETAMINOPHEN CONCENTRATIONS >150 ug/mL AT 4 HOURS AFTER INGESTION AND >50 ug/mL AT 12 HOURS AFTER INGESTION ARE OFTEN ASSOCIATED WITH TOXIC REACTIONS. Performed at [facility], [address] [place], [place] [address]     Current Facility-Administered Medications  Medication Dose Route Frequency Provider Last Rate Last Dose  . hydrochlorothiazide  (HYDRODIURIL) tablet 25 mg  25 mg Oral Daily Harvest Dark, MD   25 mg at 01/05/18 1229  . sodium chloride 0.9 % bolus 1,000 mL  1,000 mL Intravenous Once Schuyler Amor, MD       Current Outpatient Medications  Medication Sig Dispense Refill  . lansoprazole (PREVACID) 30 MG capsule Take 1 capsule (30 mg total) by mouth daily. (Patient not taking: Reported on 01/04/2018) 30 capsule 1  . lisinopril (PRINIVIL,ZESTRIL) 20 MG tablet Take 1 tablet (20 mg total) by mouth daily. (Patient not taking: Reported on 01/04/2018) 30 tablet 1  . ondansetron (ZOFRAN ODT) 4 MG disintegrating tablet Take 1 tablet (4 mg total) by mouth every 8 (eight) hours as needed for nausea or vomiting. (Patient not taking: Reported on 01/04/2018) 20 tablet 0    Musculoskeletal: Strength & Muscle Tone: within normal limits Gait & Station: normal Patient leans: N/A  Psychiatric Specialty Exam: Physical Exam  Nursing note and vitals reviewed. Constitutional: She appears well-developed and well-nourished.  HENT:  Head: Normocephalic and atraumatic.  Eyes: Conjunctivae are normal. Pupils are equal, round, and reactive to light.  Neck: Normal range of motion.  Cardiovascular: Regular rhythm and normal heart sounds.  Respiratory: Effort normal. No respiratory distress.  GI: Soft.  Musculoskeletal: Normal range of motion.  Neurological: She is alert.  Skin: Skin is warm and dry.  Psychiatric: She has a normal mood and affect. Her speech is normal and behavior is normal. Thought content normal. Cognition and memory are normal. She expresses impulsivity.    Review of Systems  Constitutional: Negative.   HENT: Negative.   Eyes: Negative.   Respiratory: Negative.   Cardiovascular: Negative.   Gastrointestinal: Negative.   Musculoskeletal: Negative.   Skin: Negative.   Neurological: Negative.   Psychiatric/Behavioral: Negative.     Blood pressure (!) 167/120, pulse 87, temperature 98.1 F (36.7 C), temperature  source Oral, resp. rate 16, height '5\' 11"'$  (1.803 m), weight 64.9 kg (143 lb), last menstrual period 12/31/2017, SpO2 100 %.Body mass index is 19.94 kg/m.  General Appearance: Casual  Eye Contact:  Good  Speech:  Clear and Coherent  Volume:  Normal  Mood:  Euthymic  Affect:  Congruent  Thought Process:  Goal Directed  Orientation:  Full (Time, Place, and Person)  Thought Content:  Logical  Suicidal Thoughts:  No  Homicidal Thoughts:  No  Memory:  Immediate;   Fair Recent;   Fair Remote;   Fair  Judgement:  Fair  Insight:  Shallow  Psychomotor Activity:  Normal  Concentration:  Concentration: Fair  Recall:  [employer] of Knowledge:  Fair  Language:  Fair  Akathisia:  No  Handed:  Right  AIMS (if indicated):     Assets:  Housing Resilience  ADL's:  Intact  Cognition:  WNL  Sleep:        Treatment  Plan Summary: Plan Patient presented to the emergency room intoxicated last night.  There were concerns as far as I can tell that she might be unsafe to care for her child which is probably the justification for the commitment.  Patient is currently sober.  Not showing symptoms of mood disorder or psychosis.  Denies suicidal or homicidal ideation.  Does not meet commitment criteria.  Social services did become involved and apparently have taken care of the problem of making sure the child has a safe place to stay.  Patient is taken off commitment.  She was counseled about alcohol abuse and urged to consider whether she might have a problem with drug abuse.  Case reviewed with TTS and emergency room physician.  Disposition: No evidence of imminent risk to self or others at present.   Patient does not meet criteria for psychiatric inpatient admission. Supportive therapy provided about ongoing stressors.  Alethia Berthold, MD 01/05/2018 2:53 PM

## 2018-01-05 NOTE — ED Provider Notes (Addendum)
----------------------------------------- 5:58 AM on 01/05/2018 -----------------------------------------  The patient has been resting comfortably overnight.  I checked on her once when she had asked to talk to sleep by the time I checked on her.  I was just informed that she was awake and wanted to speak with me so I went into the room with her nurse and Maralyn SagoSarah the ED tech who has been her one-on-one sitter.  The patient is coherent at this point and obviously unhappy about being here.  She told me she was ready to go.  I explained to her that she was almost taken to jail but instead was brought to the ED because of her daughter, that she was placed under involuntary commitment due to her aggressive behavior in the ED due to her intoxication and the fact that in that situation she does not have the capacity to make her own decisions, the fact that she was caring for her daughter while intoxicated, and the hostile and belligerent behavior she was exhibiting towards the police and ED staff.  She states that she was "just giving them lip" and I acknowledge that that may be the case but the situation we find ourselves and now is that she needs to be cleared by psychiatry in order to have the involuntary commitment reversed.  I explained to her that her daughter was safe and being cared for by Beaulah CorinAunt Michelle.   She told me that she is from [place] and that she would like to speak with an attorney so that she can sue all of us.  I explained that she certainly has that right after all of the situation is cleared up and I encouraged her that she can direct any concerns she has to patient relations as well.  She said that she will do that.  She is currently calm and cooperative.  I considered ordering a specialist on-call psychiatry consult, but based on the time of day and the fact that Dr. Toni Amendlapacs will be here in person soon, I think it is actually in her best interest to have an in person evaluation with Dr. Toni Amendlapacs  who very well may rescind the involuntary commitment.  However given the circumstances of the initial commitment by law enforcement and the belligerent and dangerous behavior she exhibited when she was first in the emergency department with Dr. Alphonzo LemmingsMcShane, I am not comfortable rescinding the commitment without a formal psychiatric evaluation.  Additionally, it is notable that given the high level of intoxication initially, based upon standard metabolization rate of ethanol, she is still technically intoxicated.  I am concerned that she may still not have the capacity to make safe and rationale decisions and that she may continue to represent a danger to herself or others.  It is the standard of care in this community that patients under these circumstances be evaluated and cleared (and IVC rescinded) by psychiatry, not by the ED physician.  Of note, her blood pressure remains elevated although it has had some variability throughout the night.  The diastolic pressure consistently remains over 100.  However seems to be a chronic condition.  She has no acute abnormalities on her labs other than her elevated ethanol level and a slightly decreased potassium of 3.3.  There is no evidence that she has any hypertensive urgency or emergency which would require emergent intervention on the blood pressure.     ----------------------------------------- 6:33 AM on 01/05/2018 -----------------------------------------  Given that we do not know if the patient is a regular,  chronic drinker of alcohol, I ordered a CIWA assessment.      Loleta Rose, MD 01/05/18 [phone]

## 2018-01-05 NOTE — ED Notes (Signed)
[facility]

## 2018-01-05 NOTE — ED Provider Notes (Signed)
-----------------------------------------   2:28 PM on 01/05/2018 -----------------------------------------  Patient has been seen by psychiatry.  They believe the patient is safe for discharge home from a psychiatric standpoint.  Patient's medical workup has been largely nonrevealing besides a significantly elevated alcohol level however this was last night.  Urine drug screen negative.   Minna AntisPaduchowski, Parisha Beaulac, MD 01/05/18 1428

## 2018-12-21 ENCOUNTER — Other Ambulatory Visit: Payer: Self-pay | Admitting: Family Medicine

## 2018-12-21 DIAGNOSIS — Z3481 Encounter for supervision of other normal pregnancy, first trimester: Secondary | ICD-10-CM

## 2018-12-27 ENCOUNTER — Ambulatory Visit: Payer: Medicaid Other

## 2018-12-28 ENCOUNTER — Other Ambulatory Visit: Payer: Self-pay | Admitting: Family Medicine

## 2018-12-28 ENCOUNTER — Ambulatory Visit
Admission: RE | Admit: 2018-12-28 | Discharge: 2018-12-28 | Disposition: A | Discharge status: Home / Self Care | Payer: Medicaid Other | Source: Ambulatory Visit | Attending: Family Medicine | Admitting: Family Medicine

## 2018-12-28 DIAGNOSIS — Z3481 Encounter for supervision of other normal pregnancy, first trimester: Secondary | ICD-10-CM | POA: Insufficient documentation

## 2019-01-28 ENCOUNTER — Other Ambulatory Visit: Payer: Self-pay | Admitting: Physician Assistant

## 2019-01-28 ENCOUNTER — Other Ambulatory Visit (HOSPITAL_COMMUNITY): Payer: Self-pay | Admitting: Physician Assistant

## 2019-01-28 DIAGNOSIS — Z3482 Encounter for supervision of other normal pregnancy, second trimester: Secondary | ICD-10-CM

## 2019-02-01 ENCOUNTER — Ambulatory Visit
Admission: RE | Admit: 2019-02-01 | Discharge: 2019-02-01 | Disposition: A | Discharge status: Home / Self Care | Payer: Medicaid Other | Source: Ambulatory Visit | Attending: Physician Assistant | Admitting: Physician Assistant

## 2019-02-01 DIAGNOSIS — Z3482 Encounter for supervision of other normal pregnancy, second trimester: Secondary | ICD-10-CM | POA: Diagnosis present

## 2019-05-08 ENCOUNTER — Other Ambulatory Visit: Payer: Self-pay | Admitting: Physician Assistant

## 2019-05-08 ENCOUNTER — Other Ambulatory Visit (HOSPITAL_COMMUNITY): Payer: Self-pay | Admitting: Physician Assistant

## 2019-05-08 DIAGNOSIS — Z3483 Encounter for supervision of other normal pregnancy, third trimester: Secondary | ICD-10-CM

## 2019-05-08 DIAGNOSIS — I10 Essential (primary) hypertension: Secondary | ICD-10-CM

## 2019-05-15 ENCOUNTER — Other Ambulatory Visit: Payer: Self-pay

## 2019-05-15 ENCOUNTER — Ambulatory Visit
Admission: RE | Admit: 2019-05-15 | Discharge: 2019-05-15 | Disposition: A | Discharge status: Home / Self Care | Payer: Medicaid Other | Source: Ambulatory Visit | Attending: Physician Assistant | Admitting: Physician Assistant

## 2019-05-15 DIAGNOSIS — Z3483 Encounter for supervision of other normal pregnancy, third trimester: Secondary | ICD-10-CM | POA: Diagnosis present

## 2019-05-15 DIAGNOSIS — I10 Essential (primary) hypertension: Secondary | ICD-10-CM | POA: Diagnosis present

## 2019-06-03 IMAGING — US US OB COMP LESS 14 WK
1 series · 14 of 28 positions shown · non-contrast
Comparison: None.

CLINICAL DATA: First trimester pregnancy.  Unknown LMP.

EXAM:
OBSTETRIC <14 WK ULTRASOUND
TECHNIQUE: Transabdominal ultrasound was performed for evaluation of the
gestation as well as the maternal uterus and adnexal regions.

[Series 1: us ob comp less 14 wk · 14 of 49 slices shown]
[im 2/49]
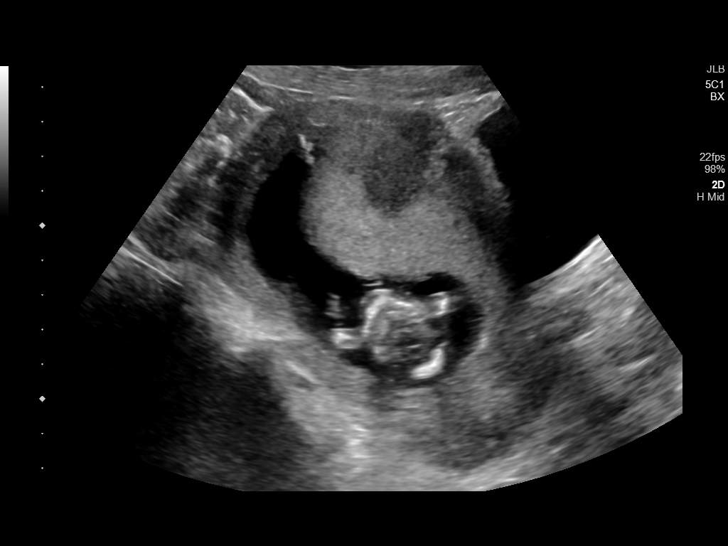
[im 6/49]
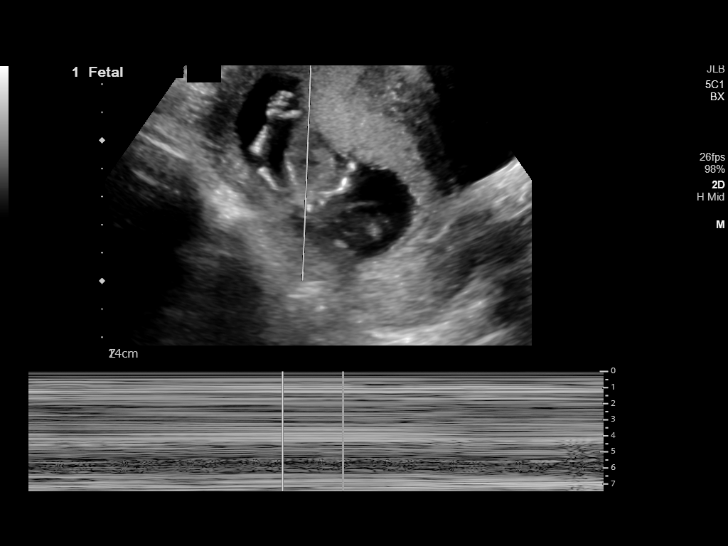
[im 9/49]
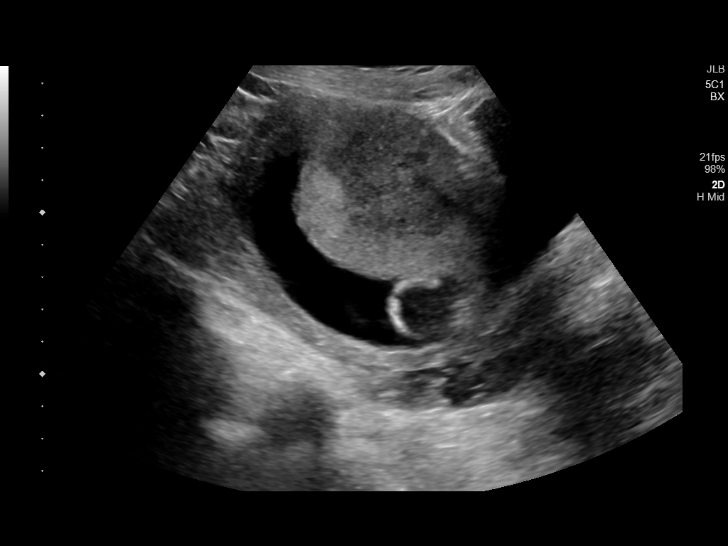
[im 13/49]
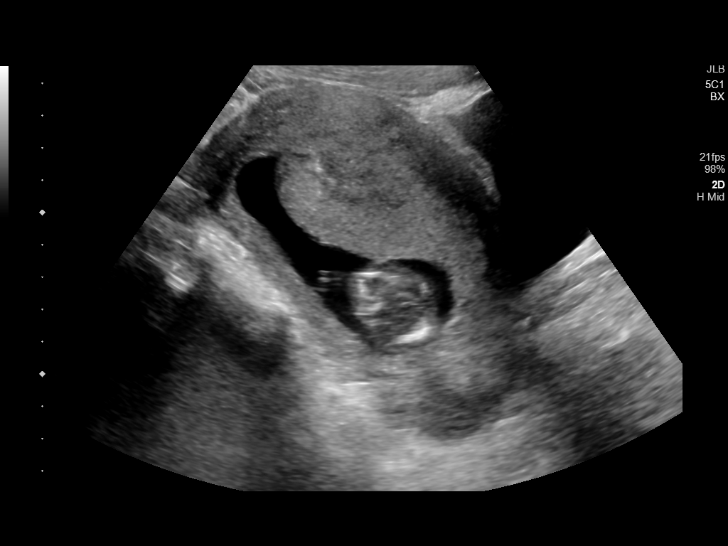
[im 17/49]
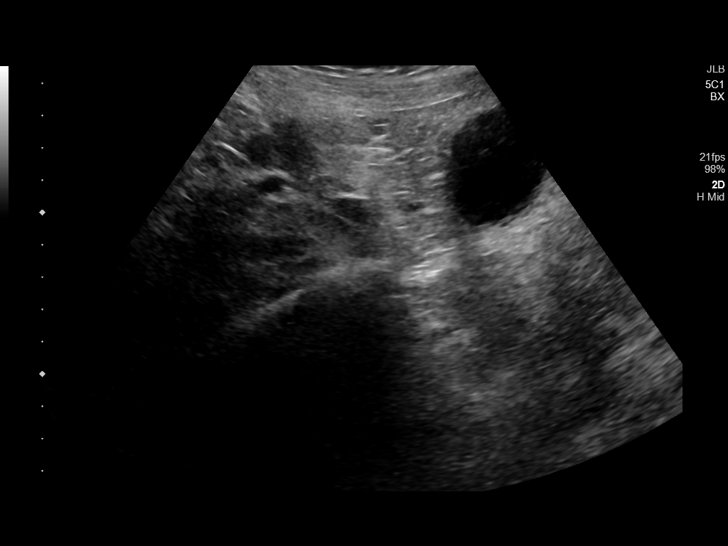
[im 20/49]
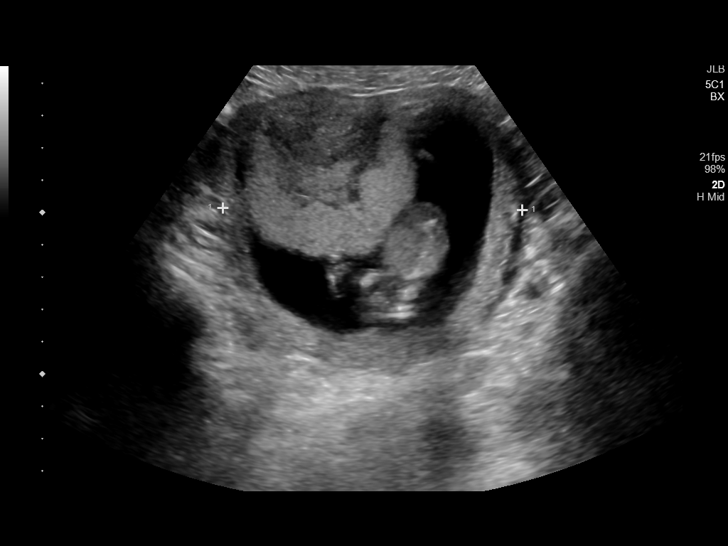
[im 24/49]
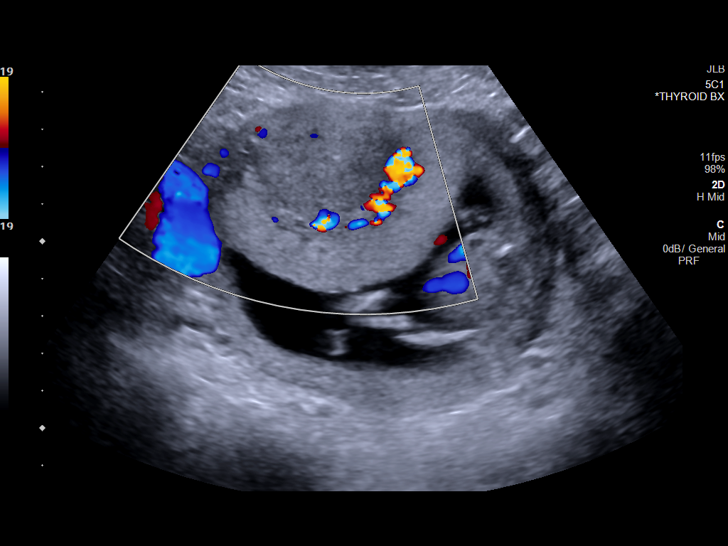
[im 27/49]
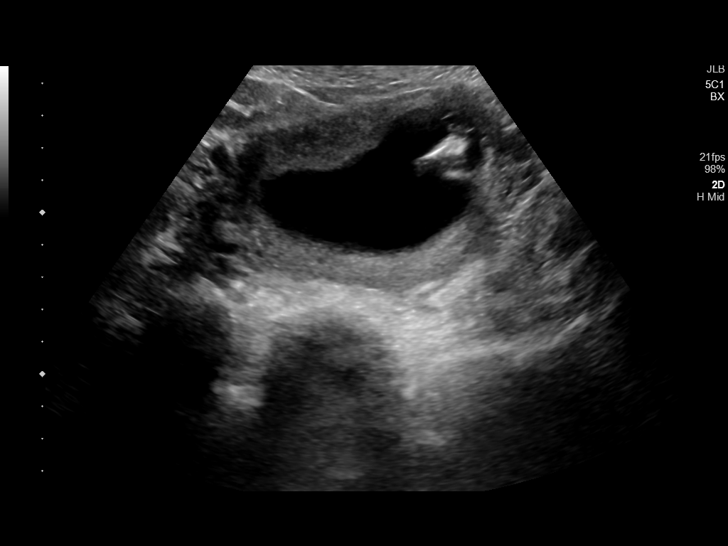
[im 31/49]
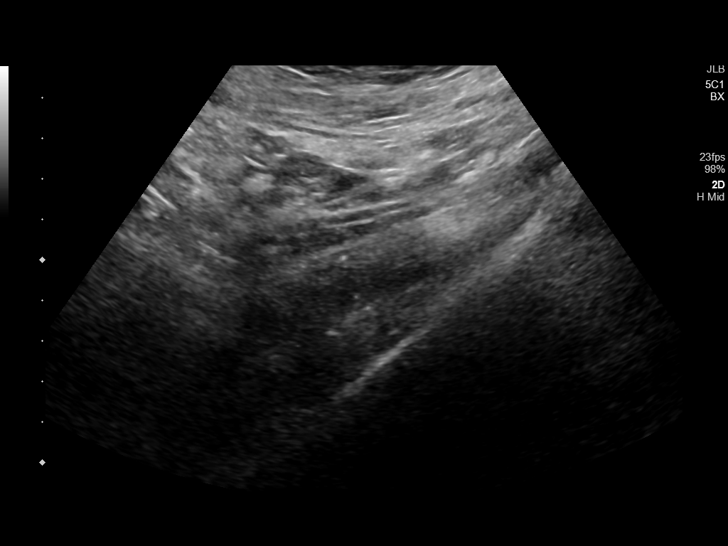
[im 34/49]
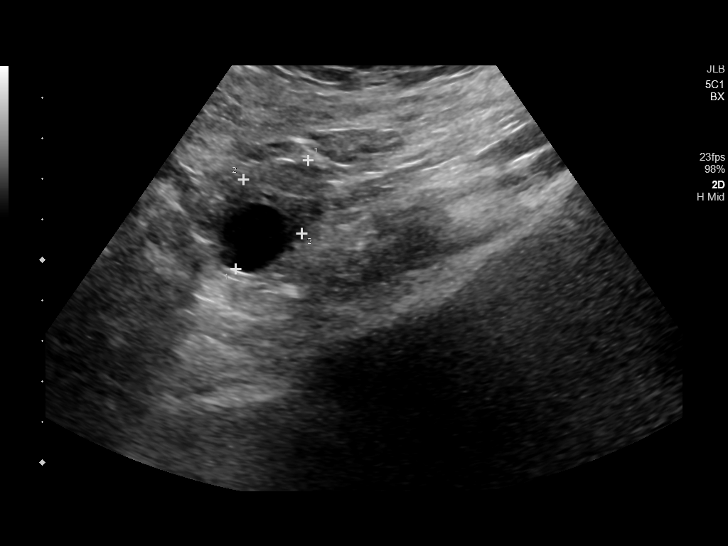
[im 38/49]
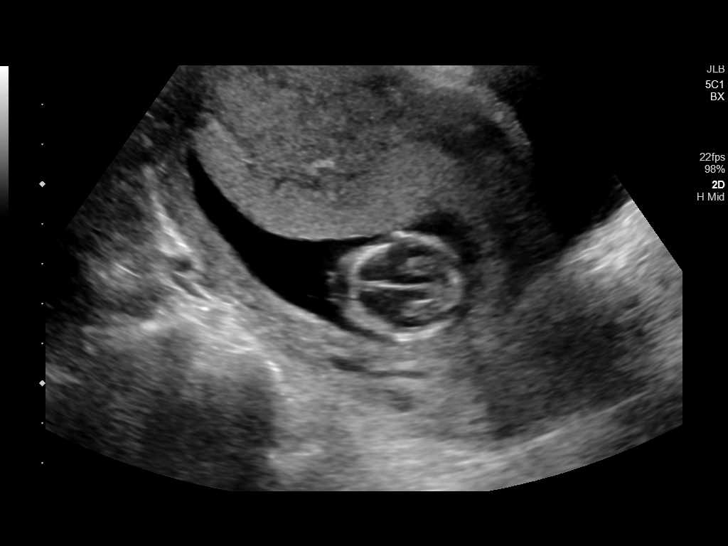
[im 41/49]
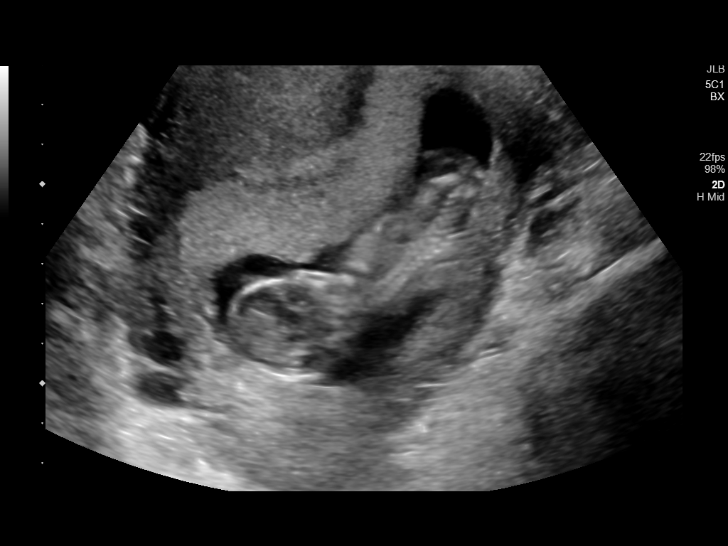
[im 45/49]
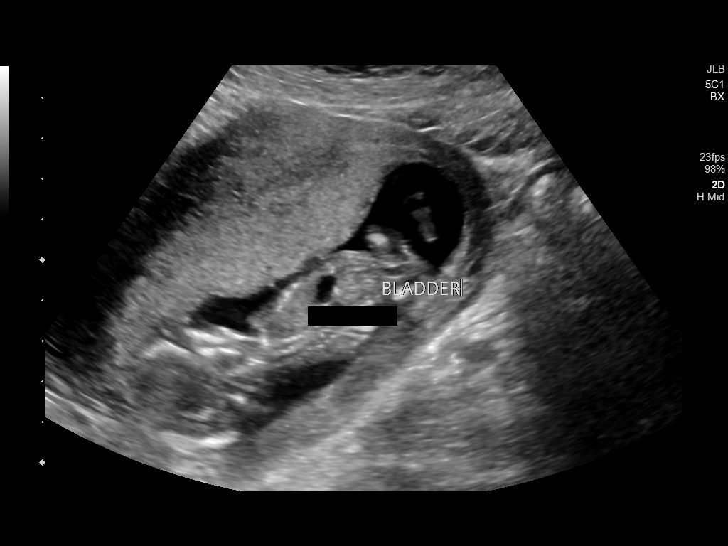
[im 49/49]
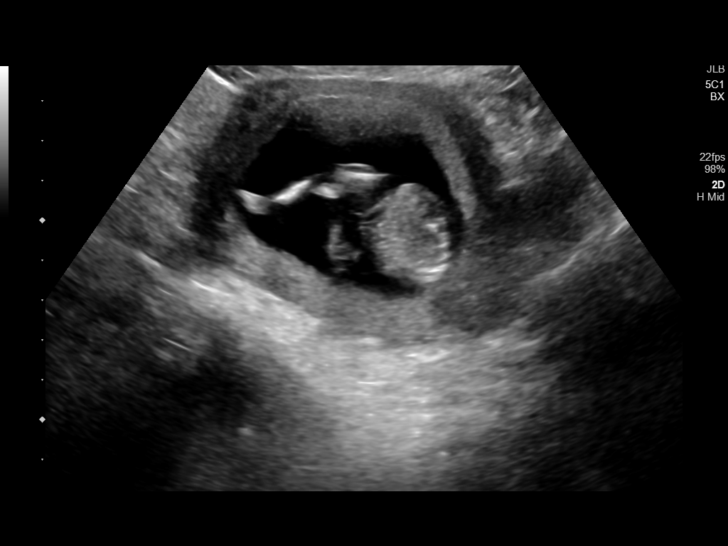

[14 of 28 positions shown; findings below may reference images not displayed]

FINDINGS: Intrauterine gestational sac: Single

Yolk sac:  Not Visualized.

Embryo:  Visualized.

Cardiac Activity: Visualized.

Heart Rate: 169 bpm

CRL:   75 mm   13 w 4 d                  US EDC: 07/01/2019

Subchorionic hemorrhage:  None visualized.

Maternal uterus/adnexae: Normal appearance of both ovaries. No mass
or abnormal free fluid identified.
IMPRESSION: Single living IUP measuring 13 weeks 4 days, with US EDC of
07/01/2019.

No significant maternal uterine or adnexal abnormality identified.

## 2019-07-08 IMAGING — US US OB COMP +14 WK
1 series · 13 of 28 positions shown · non-contrast
Comparison: none

CLINICAL DATA: Current assigned gestational age of 18 weeks 4 days
by early ultrasound. Evaluate fetal anatomy and growth.

EXAM:
OBSTETRICAL ULTRASOUND >14 WKS

[Series 1: us ob comp +14 wk · 13 of 87 slices shown]
[im 4/87]
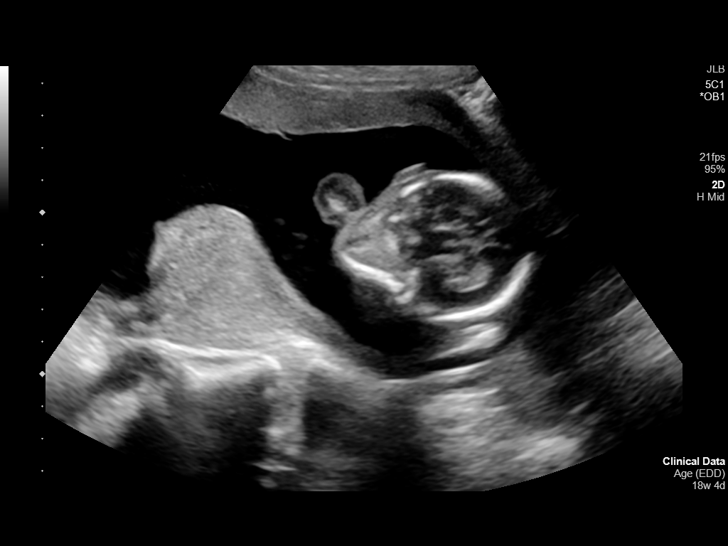
[im 10/87]
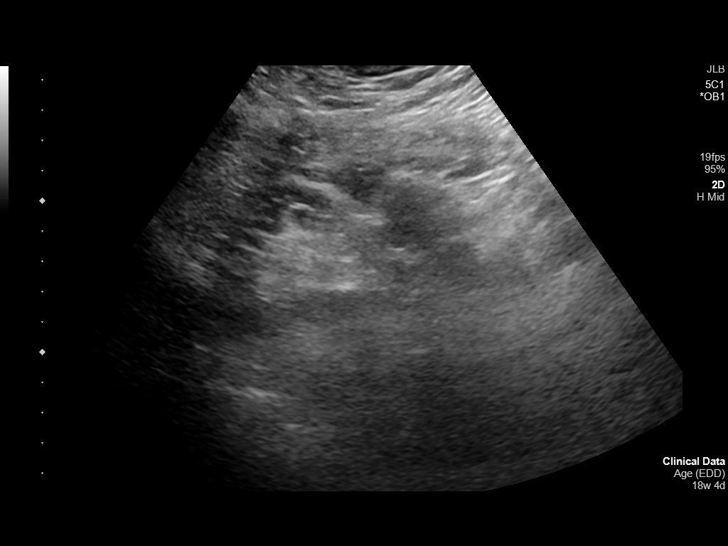
[im 16/87]
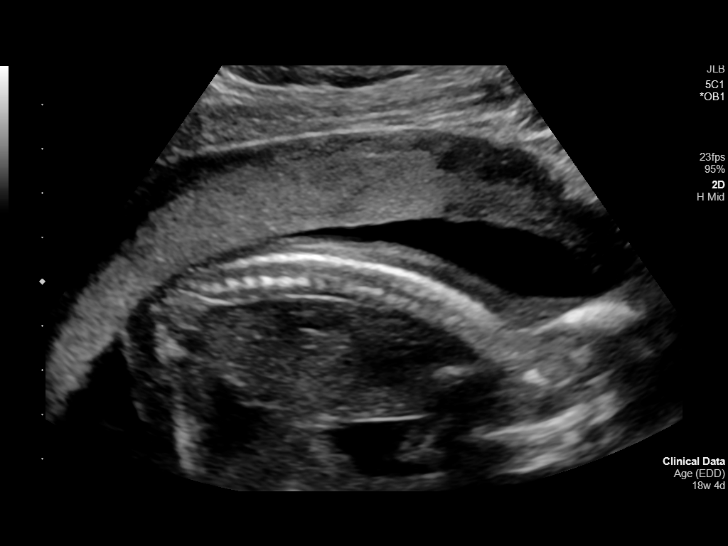
[im 23/87]
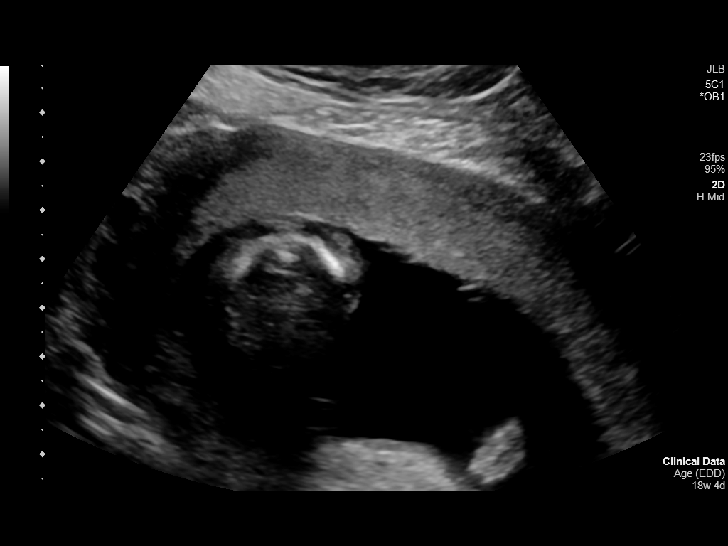
[im 29/87]
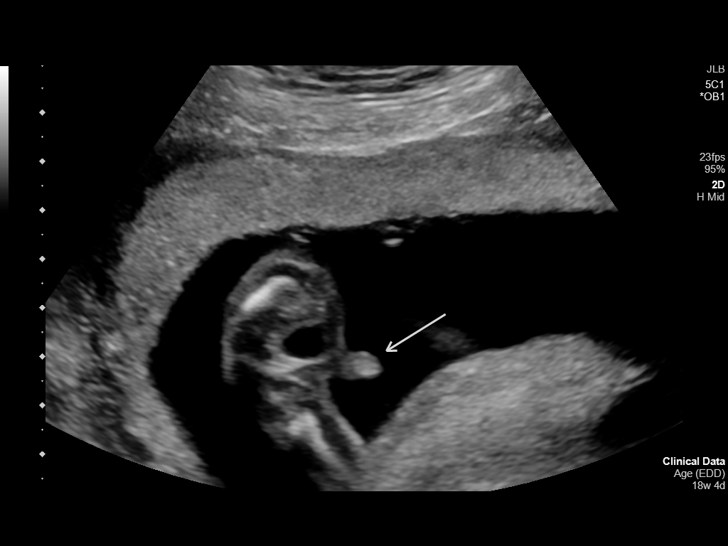
[im 36/87]
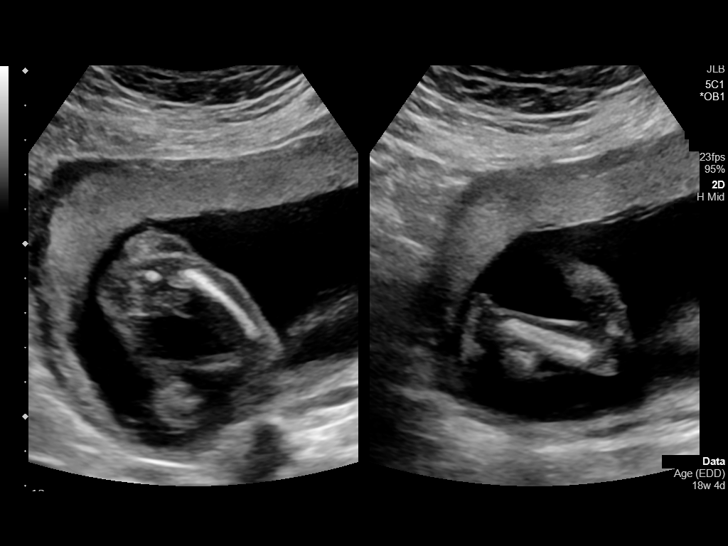
[im 45/87]
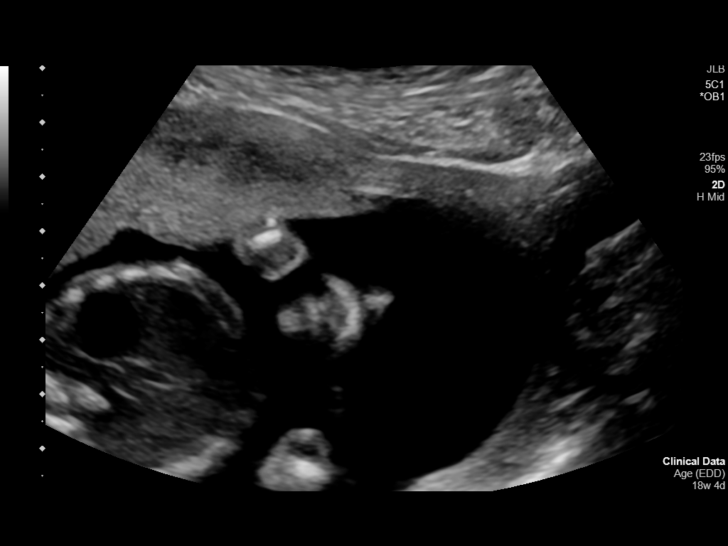
[im 51/87]
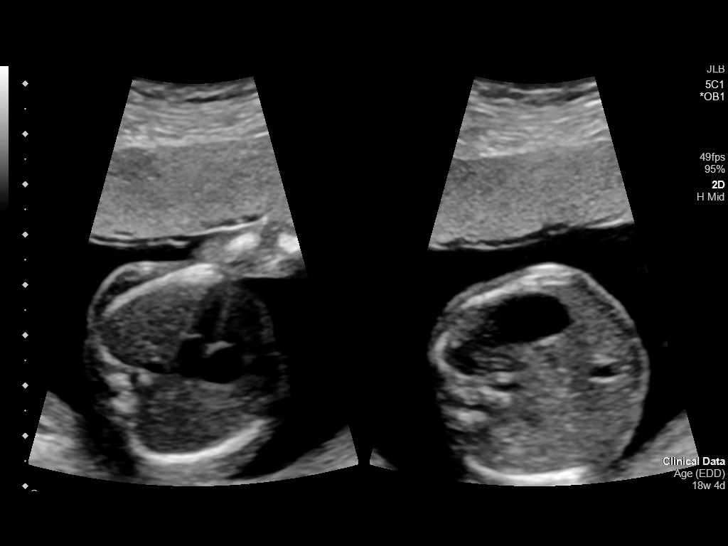
[im 58/87]
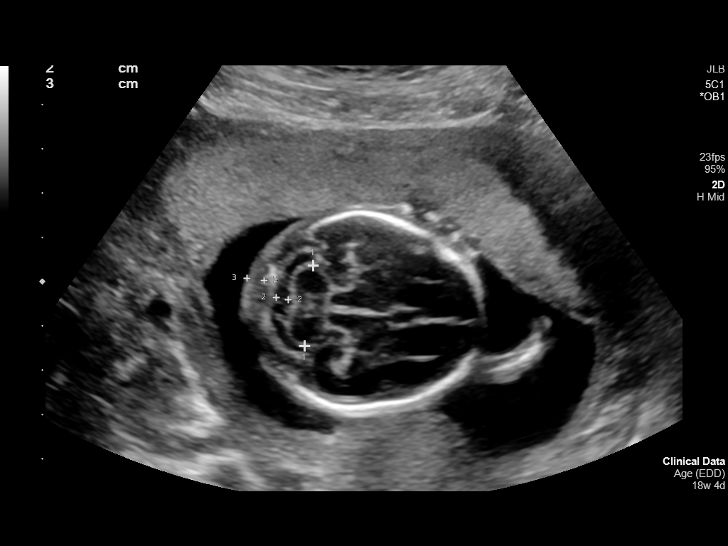
[im 64/87]
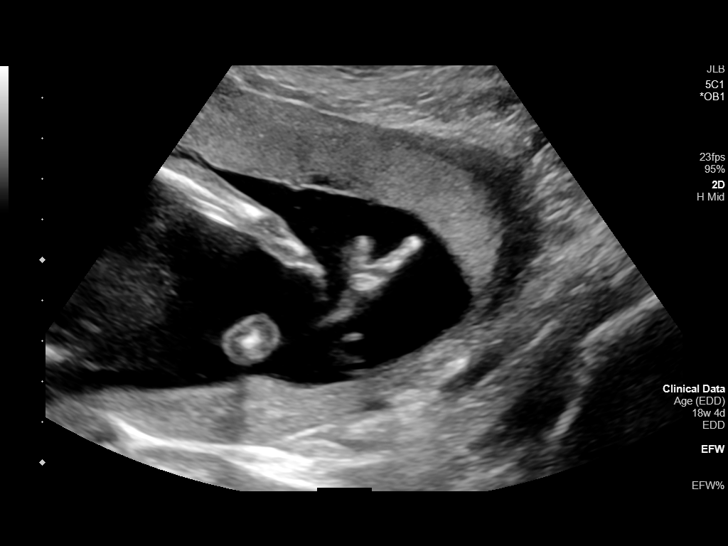
[im 71/87]
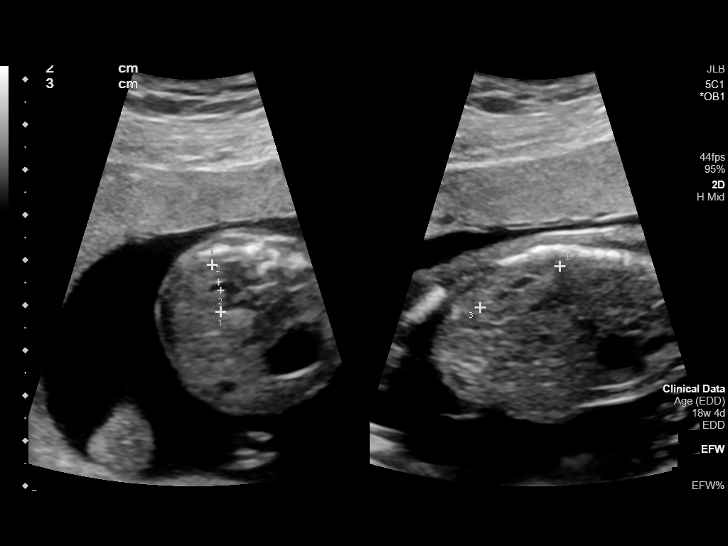
[im 77/87]
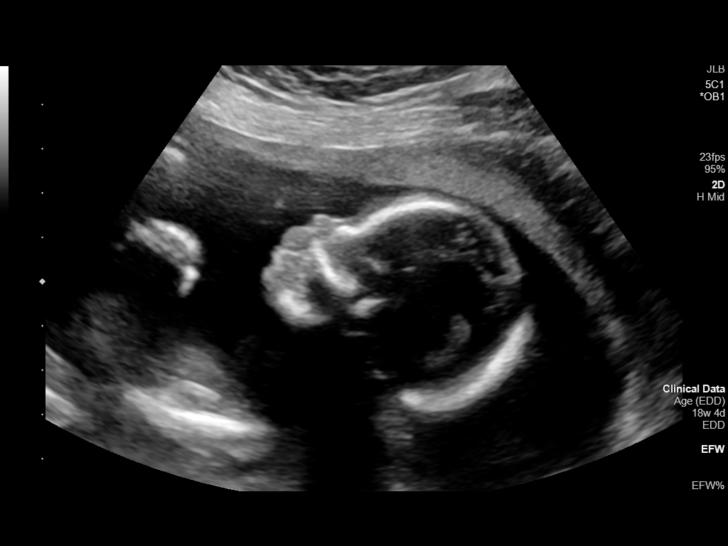
[im 83/87]
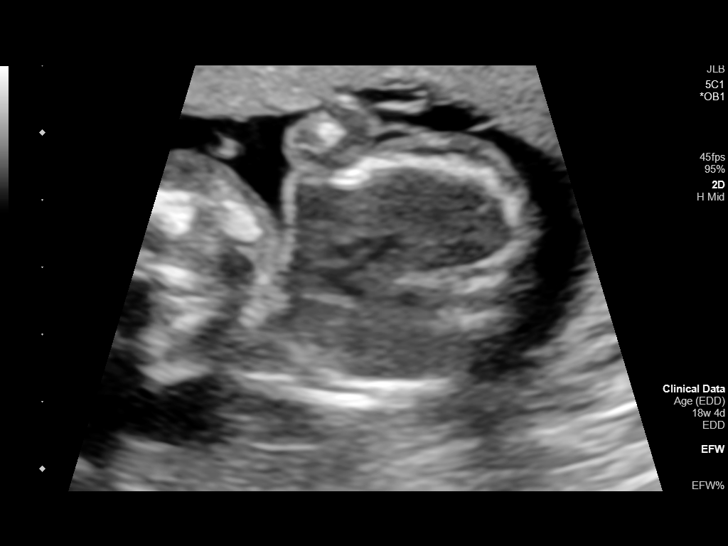

[13 of 28 positions shown; findings below may reference images not displayed]

FINDINGS: Number of Fetuses: 1

Heart Rate:  150 bpm

Movement: Yes

Presentation: Cephalic

Previa: No

Placental Location: Anterior

Amniotic Fluid (Subjective): Within normal limits

Amniotic Fluid (Objective):

Vertical pocket = 4.5cm

FETAL BIOMETRY

BPD: 4.4cm 19w 2d

HC:   16.0cm 18w 6d

AC:   13.4cm 18w 6d

FL:   2.9cm 18w 6d

Current Mean GA: 19w 0d US EDC: 06/28/2019

Assigned GA:  18w 4d Assigned EDC: 07/01/2019

FETAL ANATOMY

Lateral Ventricles: Appears normal

Thalami/CSP: Appears normal

Posterior Fossa:  Appears normal

Nuchal Region: Appears normal   NFT= 3.9 mm

Upper Lip: Appears normal

Spine: Appears normal

4 Chamber Heart on Left: Appears normal

LVOT: Appears normal

RVOT: Appears normal

Stomach on Left: Appears normal

3 Vessel Cord: Appears normal

Cord Insertion site: Appears normal

Kidneys: Appears normal

Bladder: Appears normal

Extremities: Appears normal

Sex: Male

Maternal Findings:

Cervix:  3.3 cm TA
IMPRESSION: Single living IUP with assigned gestational age of 18 weeks 4 days.
Appropriate fetal growth.

Normal visualized fetal anatomy.  No anomalies identified.

## 2019-10-19 IMAGING — US OBSTETRIC ULTRASOUND FOLLOW-UP
1 series · 13 of 28 positions shown · non-contrast
Comparison: none

CLINICAL DATA: Maternal hypertension. Follow-up fetal growth and
amniotic fluid.

EXAM:
OBSTETRIC 14+ WK ULTRASOUND FOLLOW-UP

[Series 1: obstetric ultrasound follow-up · 0.23mm/px · 13 of 48 slices shown]
[im 2/48]
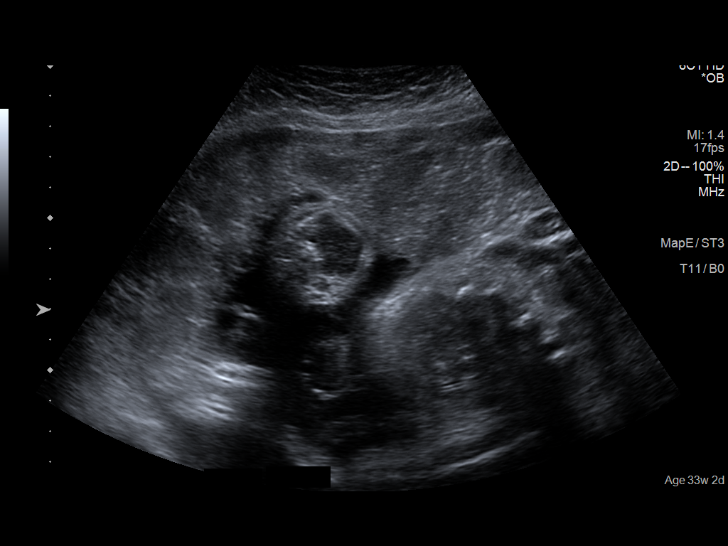
[im 6/48]
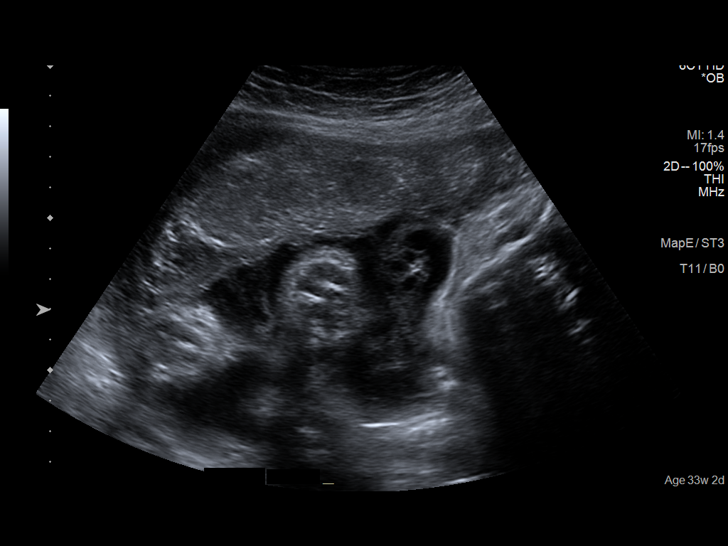
[im 9/48]
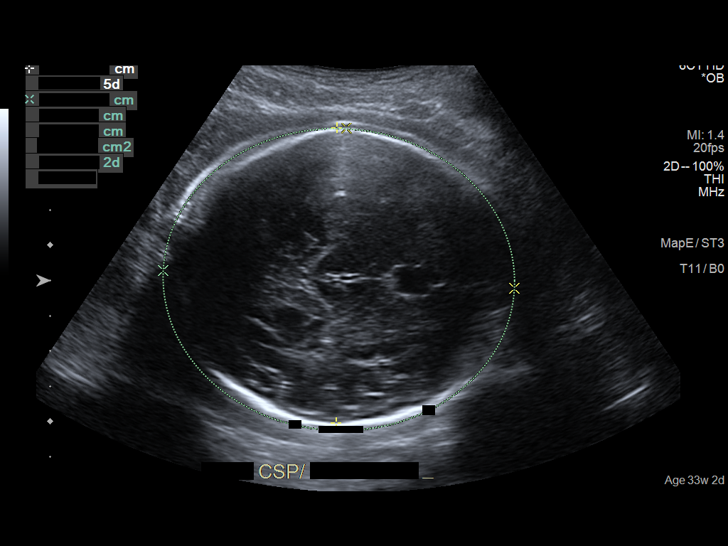
[im 13/48]
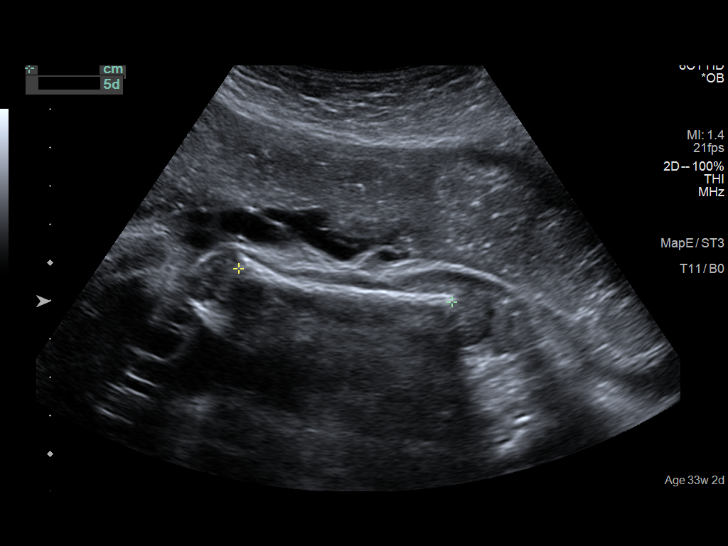
[im 16/48]
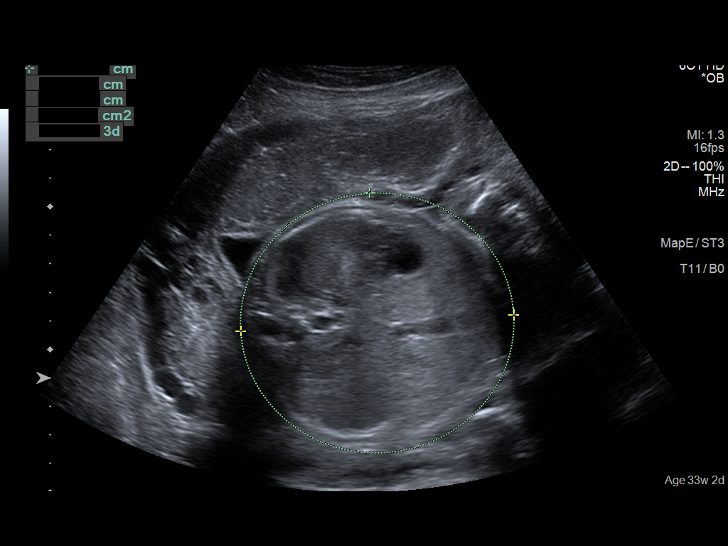
[im 20/48]
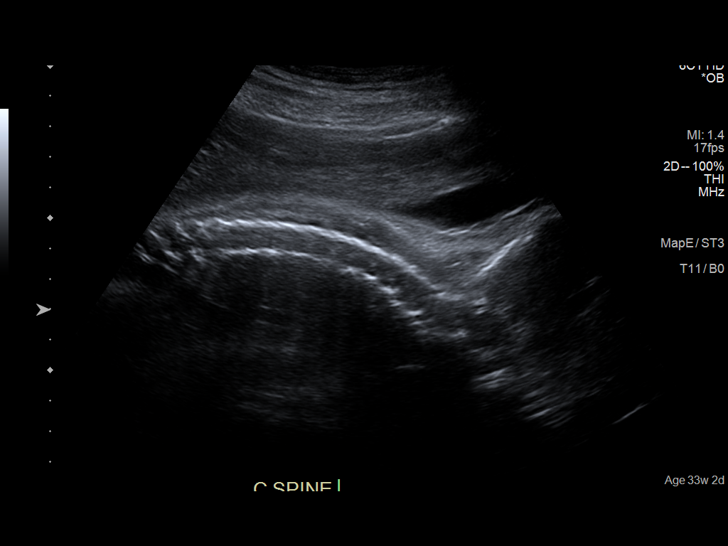
[im 25/48]
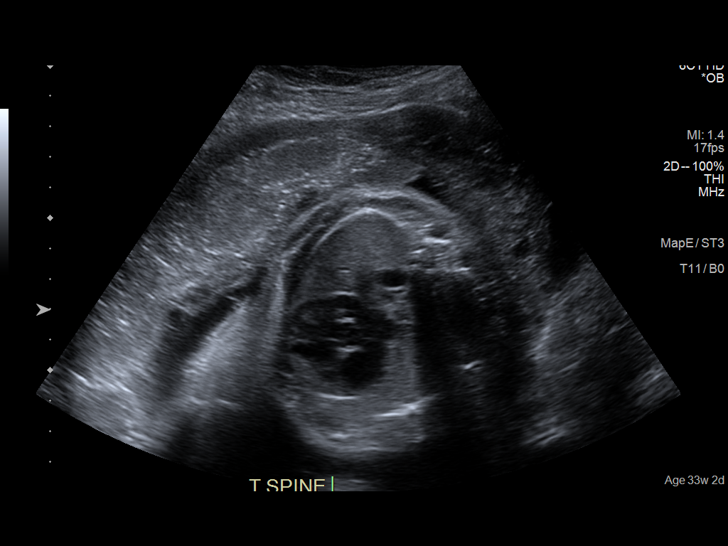
[im 28/48]
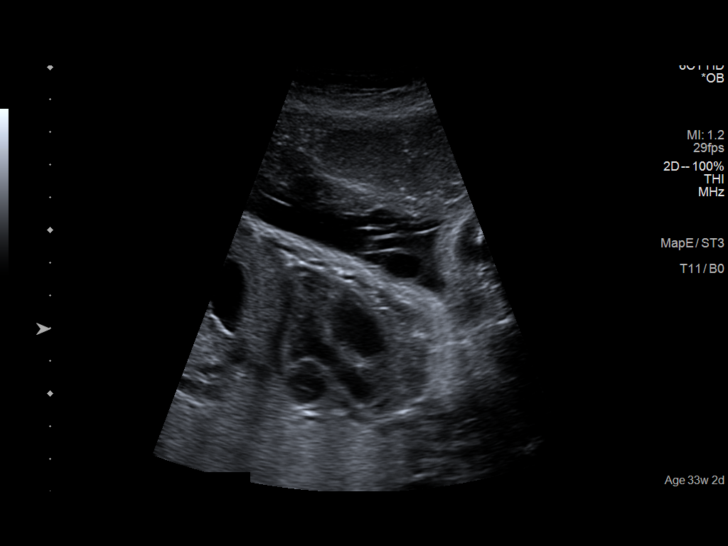
[im 32/48]
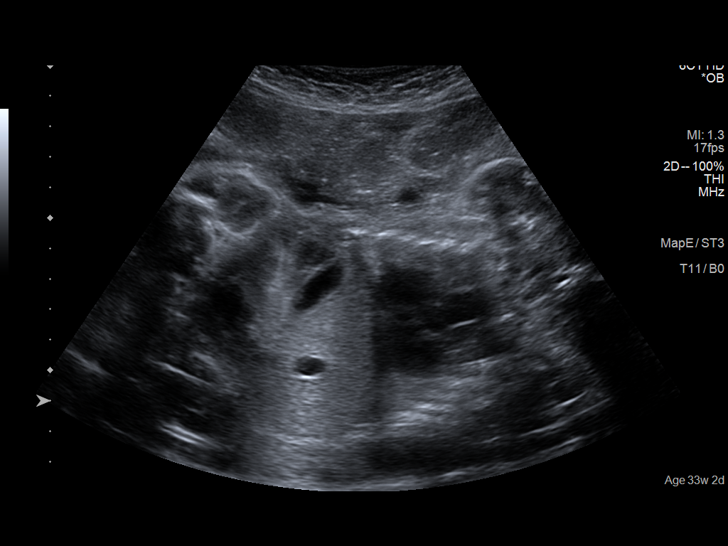
[im 35/48]
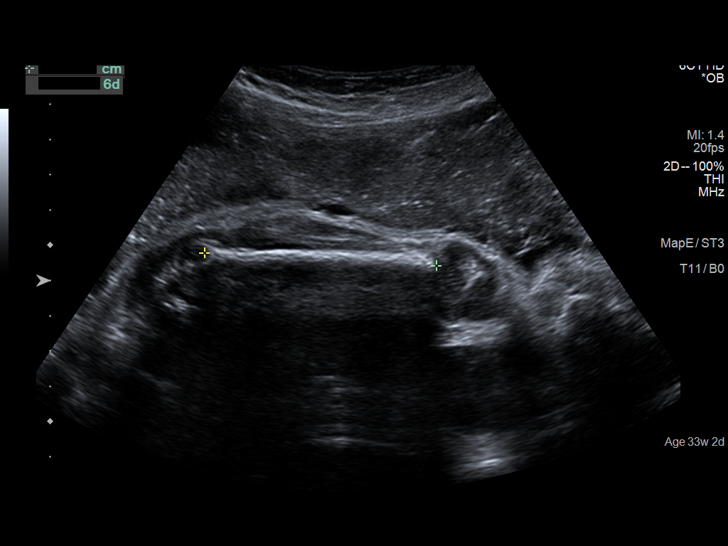
[im 39/48]
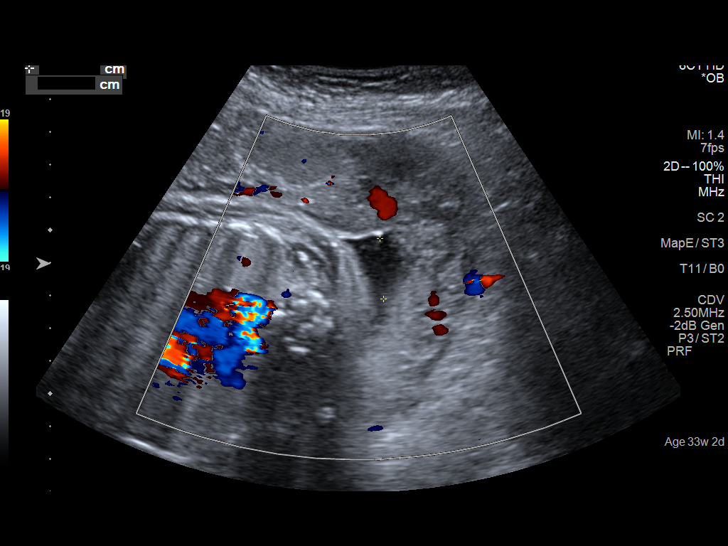
[im 42/48]
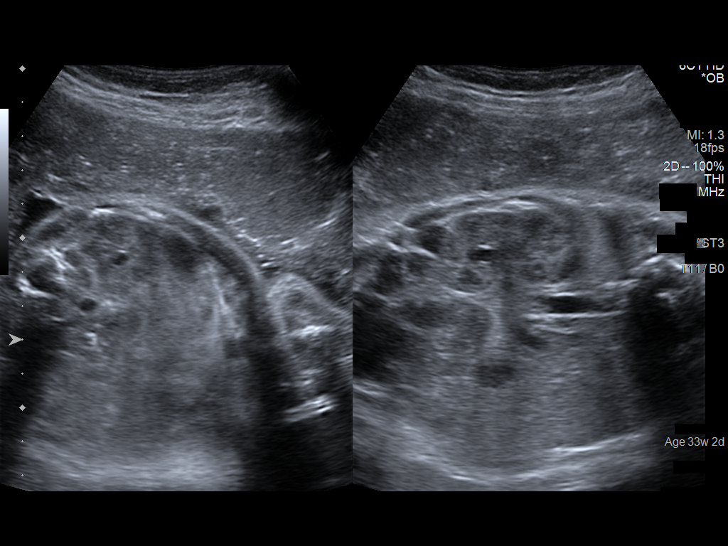
[im 46/48]
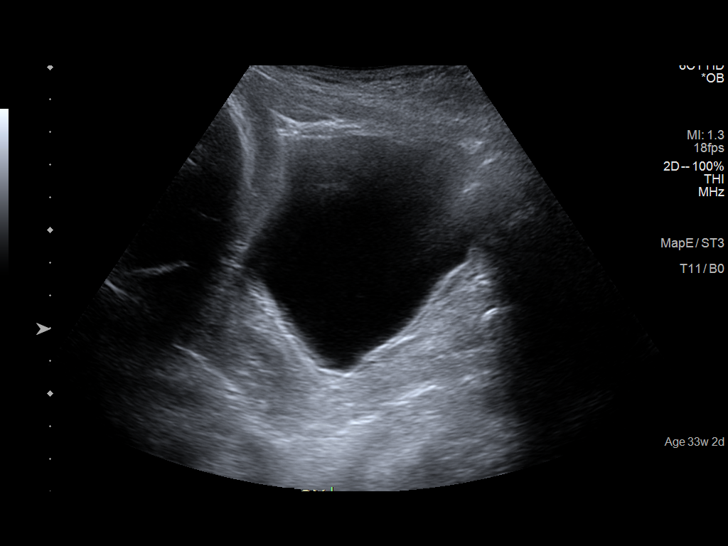

[13 of 28 positions shown; findings below may reference images not displayed]

FINDINGS: Number of Fetuses: 1

Heart Rate:  140 bpm

Movement: Yes

Presentation: Cephalic

Previa: No

Placental Location: Anterior

Amniotic Fluid (Subjective): Decreased

Amniotic Fluid (Objective):

Vertical pocket 3.6cm

AFI 8.0 cm (5%ile= 8.3 cm, 95%= 24.5 cm for 33 wks)

FETAL BIOMETRY

BPD:  8.4cm 33w 5d

HC:    29.3cm 32w 2d

AC:    28.9cm 32w 6d

FL:    6.5cm 33w 5d

Current Mean GA: 33w 1d US EDC: 07/02/2019

Assigned GA: 33w 2d Assigned EDC: 07/01/2019

Estimated Fetal Weight:  2,122g 36%ile

FETAL ANATOMY

Lateral Ventricles: Appears normal

Thalami/CSP: Appears normal

Posterior Fossa: Appears normal

Nuchal Region: Previously seen

Upper Lip: Previously seen

Spine: Previously seen

4 Chamber Heart on Left: Appears normal

LVOT: Appears normal

RVOT: Previously seen

Stomach on Left: Appears normal

3 Vessel Cord: Appears normal

Cord Insertion site: Previously seen

Kidneys: Appears normal

Bladder: Appears normal

Extremities: Previously seen

Sex: Previously seen

Technical Limitations: Advanced gestational age

Maternal Findings:

Cervix:  4.4 cm TA
IMPRESSION: Current assigned gestational age of 33 weeks 2 days. EFW is
currently at 36 %ile.

Subjectively decreased amniotic fluid volume, with AFI of 8.0 cm.

Normal cervical length.

## 2020-02-28 ENCOUNTER — Ambulatory Visit: Payer: Medicaid Other | Attending: Internal Medicine

## 2020-02-28 DIAGNOSIS — Z23 Encounter for immunization: Secondary | ICD-10-CM

## 2020-02-28 NOTE — Progress Notes (Signed)
   Covid-19 Vaccination Clinic  Name:  Ashley Potts    MRN: [id number] DOB: [birth year]  02/28/2020  [name] was observed post Covid-19 immunization for 15 minutes without incident. She was provided with Vaccine Information Sheet and instruction to access the V-Safe system.   [name] was instructed to call 911 with any severe reactions post vaccine: Marland Kitchen Difficulty breathing  . Swelling of face and throat  . A fast heartbeat  . A bad rash all over body  . Dizziness and weakness   Immunizations Administered    Name Date Dose VIS Date Route   Moderna COVID-19 Vaccine 02/28/2020  3:55 PM 0.5 mL 11/19/2019 Intramuscular   Manufacturer: Moderna   Lot: [id number]   NDC: 01655-374-82

## 2020-03-31 ENCOUNTER — Ambulatory Visit: Payer: Medicaid Other | Attending: Internal Medicine

## 2020-03-31 DIAGNOSIS — Z23 Encounter for immunization: Secondary | ICD-10-CM

## 2020-03-31 NOTE — Progress Notes (Signed)
   Covid-19 Vaccination Clinic  Name:  Ashley Potts    MRN: [id number] DOB: November 26, 1981  03/31/2020  [name] was observed post Covid-19 immunization for 15 minutes without incident. She was provided with Vaccine Information Sheet and instruction to access the V-Safe system.   [name] was instructed to call 911 with any severe reactions post vaccine: Marland Kitchen Difficulty breathing  . Swelling of face and throat  . A fast heartbeat  . A bad rash all over body  . Dizziness and weakness   Immunizations Administered    Name Date Dose VIS Date Route   Moderna COVID-19 Vaccine 03/31/2020 12:38 PM 0.5 mL 11/19/2019 Intramuscular   Manufacturer: Moderna   Lot: [id number]   NDC: 16384-665-99

## 2022-07-14 ENCOUNTER — Emergency Department (HOSPITAL_COMMUNITY): Payer: Medicaid Other

## 2022-07-14 ENCOUNTER — Emergency Department (HOSPITAL_COMMUNITY)
Admission: EM | Admit: 2022-07-14 | Discharge: 2022-07-14 | Disposition: A | Discharge status: Home / Self Care | Payer: Medicaid Other | Attending: Emergency Medicine | Admitting: Emergency Medicine

## 2022-07-14 ENCOUNTER — Encounter (HOSPITAL_COMMUNITY): Payer: Self-pay

## 2022-07-14 ENCOUNTER — Other Ambulatory Visit: Payer: Self-pay

## 2022-07-14 DIAGNOSIS — R519 Headache, unspecified: Secondary | ICD-10-CM | POA: Diagnosis not present

## 2022-07-14 DIAGNOSIS — S4991XA Unspecified injury of right shoulder and upper arm, initial encounter: Secondary | ICD-10-CM | POA: Diagnosis present

## 2022-07-14 DIAGNOSIS — I160 Hypertensive urgency: Secondary | ICD-10-CM | POA: Diagnosis not present

## 2022-07-14 DIAGNOSIS — M25571 Pain in right ankle and joints of right foot: Secondary | ICD-10-CM | POA: Insufficient documentation

## 2022-07-14 DIAGNOSIS — Y92410 Unspecified street and highway as the place of occurrence of the external cause: Secondary | ICD-10-CM | POA: Diagnosis not present

## 2022-07-14 DIAGNOSIS — S46011A Strain of muscle(s) and tendon(s) of the rotator cuff of right shoulder, initial encounter: Secondary | ICD-10-CM | POA: Diagnosis not present

## 2022-07-14 DIAGNOSIS — M25511 Pain in right shoulder: Secondary | ICD-10-CM | POA: Diagnosis not present

## 2022-07-14 DIAGNOSIS — Z79899 Other long term (current) drug therapy: Secondary | ICD-10-CM | POA: Insufficient documentation

## 2022-07-14 DIAGNOSIS — S46911A Strain of unspecified muscle, fascia and tendon at shoulder and upper arm level, right arm, initial encounter: Secondary | ICD-10-CM

## 2022-07-14 MED ORDER — IBUPROFEN 600 MG PO TABS: 600.0000 mg | ORAL_TABLET | Freq: Three times a day (TID) | ORAL | 0 refills | Status: AC | PRN

## 2022-07-14 MED ORDER — IBUPROFEN 200 MG PO TABS
600.0000 mg | ORAL_TABLET | Freq: Once | ORAL | Status: AC
Start: 2022-07-14 — End: 2022-07-14
  Administered 2022-07-14: 600 mg via ORAL
  Filled 2022-07-14: qty 3
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
## 2022-07-16 ENCOUNTER — Ambulatory Visit
Admission: RE | Admit: 2022-07-16 | Discharge: 2022-07-16 | Disposition: A | Discharge status: Home / Self Care | Payer: Medicaid Other | Source: Ambulatory Visit | Attending: Physician Assistant | Admitting: Physician Assistant

## 2022-07-16 ENCOUNTER — Other Ambulatory Visit: Payer: Self-pay | Admitting: Physician Assistant

## 2022-07-16 DIAGNOSIS — M25531 Pain in right wrist: Secondary | ICD-10-CM

## 2023-06-07 ENCOUNTER — Other Ambulatory Visit: Payer: Self-pay

## 2023-06-07 ENCOUNTER — Encounter (HOSPITAL_COMMUNITY): Payer: Self-pay | Admitting: Psychiatry

## 2023-06-07 ENCOUNTER — Emergency Department (HOSPITAL_COMMUNITY)
Admission: EM | Admit: 2023-06-07 | Discharge: 2023-06-07 | Disposition: A | Discharge status: Home / Self Care | Payer: Medicaid Other | Attending: Emergency Medicine | Admitting: Emergency Medicine

## 2023-06-07 DIAGNOSIS — D72829 Elevated white blood cell count, unspecified: Secondary | ICD-10-CM | POA: Diagnosis not present

## 2023-06-07 DIAGNOSIS — I10 Essential (primary) hypertension: Secondary | ICD-10-CM | POA: Diagnosis not present

## 2023-06-07 DIAGNOSIS — F10120 Alcohol abuse with intoxication, uncomplicated: Secondary | ICD-10-CM | POA: Insufficient documentation

## 2023-06-07 DIAGNOSIS — F10129 Alcohol abuse with intoxication, unspecified: Secondary | ICD-10-CM | POA: Diagnosis present

## 2023-06-07 DIAGNOSIS — F1924 Other psychoactive substance dependence with psychoactive substance-induced mood disorder: Secondary | ICD-10-CM | POA: Diagnosis not present

## 2023-06-07 DIAGNOSIS — R443 Hallucinations, unspecified: Secondary | ICD-10-CM | POA: Diagnosis present

## 2023-06-07 DIAGNOSIS — F1994 Other psychoactive substance use, unspecified with psychoactive substance-induced mood disorder: Secondary | ICD-10-CM | POA: Diagnosis present

## 2023-06-07 DIAGNOSIS — F10920 Alcohol use, unspecified with intoxication, uncomplicated: Secondary | ICD-10-CM

## 2023-06-07 LAB — RAPID URINE DRUG SCREEN, HOSP PERFORMED
Amphetamines: NOT DETECTED
Barbiturates: NOT DETECTED
Benzodiazepines: NOT DETECTED
Cocaine: NOT DETECTED
Opiates: NOT DETECTED
Tetrahydrocannabinol: NOT DETECTED

## 2023-06-07 LAB — CBC WITH DIFFERENTIAL/PLATELET
Abs Immature Granulocytes: 0.07 10*3/uL (ref 0.00–0.07)
Basophils Absolute: 0 10*3/uL (ref 0.0–0.1)
Basophils Relative: 0 %
Eosinophils Absolute: 0 10*3/uL (ref 0.0–0.5)
Eosinophils Relative: 0 %
HCT: 43.1 % (ref 36.0–46.0)
Hemoglobin: 14.5 g/dL (ref 12.0–15.0)
Immature Granulocytes: 1 %
Lymphocytes Relative: 10 %
Lymphs Abs: 1.4 10*3/uL (ref 0.7–4.0)
MCH: 29.8 pg (ref 26.0–34.0)
MCHC: 33.6 g/dL (ref 30.0–36.0)
MCV: 88.5 fL (ref 80.0–100.0)
Monocytes Absolute: 0.5 10*3/uL (ref 0.1–1.0)
Monocytes Relative: 4 %
Neutro Abs: 11.8 10*3/uL — ABNORMAL HIGH (ref 1.7–7.7)
Neutrophils Relative %: 85 %
Platelets: 217 10*3/uL (ref 150–400)
RBC: 4.87 MIL/uL (ref 3.87–5.11)
RDW: 16 % — ABNORMAL HIGH (ref 11.5–15.5)
WBC: 13.8 10*3/uL — ABNORMAL HIGH (ref 4.0–10.5)
nRBC: 0 % (ref 0.0–0.2)

## 2023-06-07 LAB — COMPREHENSIVE METABOLIC PANEL
ALT: 44 U/L (ref 0–44)
AST: 55 U/L — ABNORMAL HIGH (ref 15–41)
Albumin: 4.1 g/dL (ref 3.5–5.0)
Alkaline Phosphatase: 79 U/L (ref 38–126)
Anion gap: >20 — ABNORMAL HIGH (ref 5–15)
BUN: 18 mg/dL (ref 6–20)
CO2: 14 mmol/L — ABNORMAL LOW (ref 22–32)
Calcium: 8.2 mg/dL — ABNORMAL LOW (ref 8.9–10.3)
Chloride: 94 mmol/L — ABNORMAL LOW (ref 98–111)
Creatinine, Ser: 0.92 mg/dL (ref 0.44–1.00)
GFR, Estimated: >60 mL/min (ref >60)
Glucose, Bld: 120 mg/dL — ABNORMAL HIGH (ref 70–99)
Potassium: 3.1 mmol/L — ABNORMAL LOW (ref 3.5–5.1)
Sodium: 130 mmol/L — ABNORMAL LOW (ref 135–145)
Total Bilirubin: 0.6 mg/dL (ref 0.3–1.2)
Total Protein: 7.9 g/dL (ref 6.5–8.1)

## 2023-06-07 LAB — ETHANOL: Alcohol, Ethyl (B): 375 mg/dL (ref <10)

## 2023-06-07 LAB — I-STAT BETA HCG BLOOD, ED (MC, WL, AP ONLY): I-stat hCG, quantitative: <5 m[IU]/mL (ref <5)

## 2023-06-07 LAB — SALICYLATE LEVEL: Salicylate Lvl: <7 mg/dL — ABNORMAL LOW (ref 7.0–30.0)

## 2023-06-07 LAB — ACETAMINOPHEN LEVEL: Acetaminophen (Tylenol), Serum: <10 ug/mL — ABNORMAL LOW (ref 10–30)

## 2023-06-07 MED ORDER — SODIUM CHLORIDE 0.9 % IV BOLUS
1000.0000 mL | Freq: Once | INTRAVENOUS | Status: AC
  Administered 2023-06-07: 1000 mL via INTRAVENOUS

## 2023-06-07 MED ORDER — OLANZAPINE 10 MG PO TBDP
10.0000 mg | ORAL_TABLET | ORAL | Status: AC
  Administered 2023-06-07: 10 mg via ORAL
  Filled 2023-06-07: qty 1

## 2023-06-07 MED ORDER — ZIPRASIDONE MESYLATE 20 MG IM SOLR
10.0000 mg | Freq: Once | INTRAMUSCULAR | Status: DC
  Filled 2023-06-07: qty 20

## 2023-06-07 NOTE — Discharge Instructions (Addendum)
You were seen in the emergency department today for alcohol use. You were also evaluated by our psychiatric team. We have provided you with resources for substance abuse treatment and counseling as well as our local behavioral health urgent care available in the area. Please seek treatment for alcohol use and follow-up with your primary care provider. Please return if you are having any withdrawal symptoms of alcohol use or have emergency psychiatric needs like suicidal or homicidal thoughts.
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
## 2023-06-07 NOTE — ED Provider Notes (Signed)
I provided a substantive portion of the care of this patient.  I personally made/approved the management plan for this patient and take responsibility for the patient management.   42 year old female here with acute intoxication.  Patient admits to alcohol and possibly the substances.  On exam she is agitated.  Will require sedation and would medically cleared will be seen by psychiatry   Lorre Nick, MD 06/07/23 [phone]

## 2023-06-07 NOTE — ED Triage Notes (Addendum)
Pt states [employer] picked her up from home and is not sure why she was brought to the hospital. Pt denies any symptoms. Pt states she had 7 shots of vodka "just because I had a day off"

## 2023-06-07 NOTE — ED Notes (Signed)
Pt sleeping in bed. Resp WDL.
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
## 2023-06-07 NOTE — ED Notes (Signed)
Patients belonging bag moved to [employer] [address].

## 2023-06-07 NOTE — ED Notes (Signed)
Psych provider at bedside

## 2024-02-19 ENCOUNTER — Encounter (HOSPITAL_COMMUNITY): Payer: Self-pay

## 2024-02-19 ENCOUNTER — Other Ambulatory Visit: Payer: Self-pay

## 2024-02-19 ENCOUNTER — Emergency Department (HOSPITAL_COMMUNITY)
Admission: EM | Admit: 2024-02-19 | Discharge: 2024-02-19 | Disposition: A | Discharge status: Home / Self Care | Payer: MEDICAID | Attending: Emergency Medicine | Admitting: Emergency Medicine

## 2024-02-19 ENCOUNTER — Emergency Department (HOSPITAL_COMMUNITY): Payer: MEDICAID

## 2024-02-19 DIAGNOSIS — E860 Dehydration: Secondary | ICD-10-CM | POA: Diagnosis not present

## 2024-02-19 DIAGNOSIS — I10 Essential (primary) hypertension: Secondary | ICD-10-CM | POA: Diagnosis not present

## 2024-02-19 DIAGNOSIS — R197 Diarrhea, unspecified: Secondary | ICD-10-CM | POA: Diagnosis present

## 2024-02-19 LAB — COMPREHENSIVE METABOLIC PANEL
ALT: 18 U/L (ref 0–44)
AST: 29 U/L (ref 15–41)
Albumin: 4 g/dL (ref 3.5–5.0)
Alkaline Phosphatase: 65 U/L (ref 38–126)
Anion gap: 15 (ref 5–15)
BUN: 5 mg/dL — ABNORMAL LOW (ref 6–20)
CO2: 21 mmol/L — ABNORMAL LOW (ref 22–32)
Calcium: 8.9 mg/dL (ref 8.9–10.3)
Chloride: 91 mmol/L — ABNORMAL LOW (ref 98–111)
Creatinine, Ser: 0.56 mg/dL (ref 0.44–1.00)
GFR, Estimated: >60 mL/min (ref >60)
Glucose, Bld: 96 mg/dL (ref 70–99)
Potassium: 3.5 mmol/L (ref 3.5–5.1)
Sodium: 127 mmol/L — ABNORMAL LOW (ref 135–145)
Total Bilirubin: 1.6 mg/dL — ABNORMAL HIGH (ref 0.0–1.2)
Total Protein: 7.6 g/dL (ref 6.5–8.1)

## 2024-02-19 LAB — LIPASE, BLOOD: Lipase: 30 U/L (ref 11–51)

## 2024-02-19 LAB — CBC
HCT: 45.2 % (ref 36.0–46.0)
Hemoglobin: 14.7 g/dL (ref 12.0–15.0)
MCH: 30.6 pg (ref 26.0–34.0)
MCHC: 32.5 g/dL (ref 30.0–36.0)
MCV: 94 fL (ref 80.0–100.0)
Platelets: 213 10*3/uL (ref 150–400)
RBC: 4.81 MIL/uL (ref 3.87–5.11)
RDW: 14 % (ref 11.5–15.5)
WBC: 8 10*3/uL (ref 4.0–10.5)
nRBC: 0 % (ref 0.0–0.2)

## 2024-02-19 LAB — HCG, SERUM, QUALITATIVE: Preg, Serum: NEGATIVE

## 2024-02-19 LAB — MAGNESIUM: Magnesium: 1.4 mg/dL — ABNORMAL LOW (ref 1.7–2.4)

## 2024-02-19 LAB — TROPONIN I (HIGH SENSITIVITY): Troponin I (High Sensitivity): 3 ng/L (ref <18)

## 2024-02-19 MED ORDER — LOPERAMIDE HCL 2 MG PO CAPS: 2.0000 mg | ORAL_CAPSULE | Freq: Four times a day (QID) | ORAL | 0 refills | Status: DC | PRN

## 2024-02-19 MED ORDER — HYDRALAZINE HCL 10 MG PO TABS: 10.0000 mg | ORAL_TABLET | Freq: Three times a day (TID) | ORAL | 0 refills | Status: AC | PRN

## 2024-02-19 MED ORDER — HYDRALAZINE HCL 10 MG PO TABS: 10.0000 mg | ORAL_TABLET | Freq: Three times a day (TID) | ORAL | 0 refills | Status: DC | PRN

## 2024-02-19 MED ORDER — LOPERAMIDE HCL 2 MG PO CAPS: 2.0000 mg | ORAL_CAPSULE | Freq: Four times a day (QID) | ORAL | 0 refills | Status: AC | PRN

## 2024-02-19 MED ORDER — HYDRALAZINE HCL 10 MG PO TABS
10.0000 mg | ORAL_TABLET | Freq: Once | ORAL | Status: AC
  Administered 2024-02-19: 10 mg via ORAL
  Filled 2024-02-19: qty 1

## 2024-02-19 NOTE — Discharge Instructions (Addendum)
 Please take the medications that are prescribed for diarrhea control.  Your blood pressure was high in the emergency room.  Please read the instructions on hypertension.  As discussed, it is not advisable for emergency room to adjust your medications abruptly for isolated elevated blood pressure in the ER.  We recommend that you keep a log of your blood pressure and follow-up with your doctor in 1 week.  If your blood pressures over 180 for the top number, consider taking hydralazine that is prescribed.

## 2024-02-19 NOTE — ED Provider Notes (Signed)
 [place] EMERGENCY DEPARTMENT AT [facility] Provider Note   CSN: [id number] Arrival date & time: 02/19/24  1653     History  Chief Complaint  Patient presents with   Hypertension   Diarrhea    Ashley Potts is a 43 y.o. female.  HPI     43 year old patient comes in with chief complaint of elevated blood pressure and diarrhea. Patient has history of hypertension, remote history of alcohol abuse.  She states that she started having diarrhea yesterday.  She had at least 5 bowel movements yesterday and has had more than 5 bowel movements a day.  There is no blood in the stool.  Patient denies any associated nausea, vomiting, abdominal pain.  Review of system is negative for fevers, chills.  Patient also notes that her blood pressure has been running high since yesterday.  She felt that she was dizzy, jittery and had some spots that she could see earlier today.  Her BP was over 200 systolic, which had her concerned and she decided to come to the ER.  She had no chest pain, shortness of breath, abdominal pain, UTI-like symptoms.  No sick contacts.  She denies any substance use disorder.  Home Medications Prior to Admission medications   Medication Sig Start Date End Date Taking? Authorizing Provider  ibuprofen (ADVIL) 600 MG tablet Take 1 tablet (600 mg total) by mouth every 8 (eight) hours as needed. 07/14/22   Pricilla Loveless, MD  lansoprazole (PREVACID) 30 MG capsule Take 1 capsule (30 mg total) by mouth daily. Patient not taking: Reported on 01/04/2018 11/11/17 11/11/18  Emily Filbert, MD  lisinopril (PRINIVIL,ZESTRIL) 20 MG tablet Take 1 tablet (20 mg total) by mouth daily. Patient not taking: Reported on 01/04/2018 11/11/17   Emily Filbert, MD  ondansetron (ZOFRAN ODT) 4 MG disintegrating tablet Take 1 tablet (4 mg total) by mouth every 8 (eight) hours as needed for nausea or vomiting. Patient not taking: Reported on 01/04/2018 11/11/17   Emily Filbert, MD      Allergies    Pineapple, Sulbactam, Lisinopril, and Penicillins    Review of Systems   Review of Systems  All other systems reviewed and are negative.   Physical Exam Updated Vital Signs BP (!) 198/135   Pulse 88   Temp 98.1 F (36.7 C) (Oral)   Resp 17   Ht 5\' 11"  (1.803 m)   Wt 64.9 kg   SpO2 100%   BMI 19.94 kg/m  Physical Exam Vitals and nursing note reviewed.  Constitutional:      Appearance: She is well-developed.  HENT:     Head: Atraumatic.  Eyes:     Extraocular Movements: Extraocular movements intact.     Pupils: Pupils are equal, round, and reactive to light.     Comments: No nystagmus, visual fields intact  Cardiovascular:     Rate and Rhythm: Normal rate.  Pulmonary:     Effort: Pulmonary effort is normal.  Musculoskeletal:     Cervical back: Normal range of motion and neck supple.  Skin:    General: Skin is warm and dry.  Neurological:     Mental Status: She is alert and oriented to person, place, and time.     Cranial Nerves: No cranial nerve deficit.     Sensory: No sensory deficit.     Motor: No weakness.     Coordination: Coordination normal.     ED Results / Procedures / Treatments   Labs (all  labs ordered are listed, but only abnormal results are displayed) Labs Reviewed  COMPREHENSIVE METABOLIC PANEL - Abnormal; Notable for the following components:      Result Value   Sodium 127 (*)    Chloride 91 (*)    CO2 21 (*)    BUN 5 (*)    Total Bilirubin 1.6 (*)    All other components within normal limits  MAGNESIUM - Abnormal; Notable for the following components:   Magnesium 1.4 (*)    All other components within normal limits  LIPASE, BLOOD  CBC  HCG, SERUM, QUALITATIVE  URINALYSIS, ROUTINE W REFLEX MICROSCOPIC  TROPONIN I (HIGH SENSITIVITY)    EKG EKG Interpretation Date/Time:  Monday February 19 2024 17:55:16 EST Ventricular Rate:  106 PR Interval:  144 QRS Duration:  90 QT Interval:  354 QTC  Calculation: 471 R Axis:   82  Text Interpretation: Sinus tachycardia Probable left atrial enlargement Probable left ventricular hypertrophy No acute changes No significant change since last tracing Confirmed by Derwood Kaplan [phone]) on 02/19/2024 9:45:01 PM  Radiology DG Chest 1 View Result Date: 02/19/2024 CLINICAL DATA:  Diarrhea and elevated blood pressure. EXAM: CHEST  1 VIEW COMPARISON:  November 11, 2017 FINDINGS: The heart size and mediastinal contours are within normal limits. Both lungs are clear. The visualized skeletal structures are unremarkable. IMPRESSION: No active disease. Electronically Signed   By: Aram Candela M.D.   On: 02/19/2024 21:10   CT Head Wo Contrast Result Date: 02/19/2024 CLINICAL DATA:  Diarrhea and hypertension. EXAM: CT HEAD WITHOUT CONTRAST TECHNIQUE: Contiguous axial images were obtained from the base of the skull through the vertex without intravenous contrast. RADIATION DOSE REDUCTION: This exam was performed according to the departmental dose-optimization program which includes automated exposure control, adjustment of the mA and/or kV according to patient size and/or use of iterative reconstruction technique. COMPARISON:  None Available. FINDINGS: Brain: No evidence of acute infarction, hemorrhage, hydrocephalus, extra-axial collection or mass lesion/mass effect. Vascular: No hyperdense vessel or unexpected calcification. Skull: Normal. Negative for fracture or focal lesion. Sinuses/Orbits: There is mild to moderate severity bilateral ethmoid sinus mucosal thickening. Other: None. IMPRESSION: 1. No acute intracranial abnormality. 2. Mild to moderate severity bilateral ethmoid sinus disease. Electronically Signed   By: Aram Candela M.D.   On: 02/19/2024 21:10    Procedures Procedures    Medications Ordered in ED Medications - No data to display  ED Course/ Medical Decision Making/ A&P                                 Medical Decision  Making Amount and/or Complexity of Data Reviewed Labs: ordered.   43 year old female comes in with chief complaint of diarrhea and elevated blood pressure.  She has history of hypertension, alcohol use disorder.  Differential diagnosis considered for this patient includes infectious diarrhea, food toxin mediated diarrhea, drug withdrawals.  Differential also includes electrolyte abnormality, hypertensive emergency, asymptomatic markedly elevated blood pressure.  Patient is nontoxic-appearing. Basic labs ordered, it reveals that she has slightly low sodium, low magnesium, and some signs of dehydration. Creatinine is at baseline.   I reviewed patient's blood pressure.  During my assessment, patient's blood pressure was in the 170s over 120s -however at arrival her blood pressure was over 200.  She has no red flag suggesting elevated ICP, acute neurodeficits that are focal, brain bleed, acute cardiac event.  CT scan of the brain was  ordered, independently interpreted by me.  CT scan of the head shows no evidence of brain bleed.  Plan is to give patient some hydration in the ER, replenish her magnesium and give her as needed meds.  As needed hydralazine also added.  Advised to follow-up with her PCP.  Strict ER return precautions have been discussed, and patient is agreeing with the plan and is comfortable with the workup done and the recommendations from the ER.   Final Clinical Impression(s) / ED Diagnoses Final diagnoses:  Diarrhea of presumed infectious origin  Hypomagnesemia  Hypertension, unspecified type  Dehydration    Rx / DC Orders ED Discharge Orders     None         Derwood Kaplan, MD 02/19/24 2207

## 2024-02-19 NOTE — ED Provider Triage Note (Cosign Needed Addendum)
 Emergency Medicine Provider Triage Evaluation Note  Ashley Potts , a 43 y.o. female  was evaluated in triage.  Pt complains of diarrhea and hypertension.  Area started yesterday.  Denies associated nausea vomiting or abdominal pain.  Also states her blood pressure has been abnormally high.  Endorses bilateral visual disturbance that started this morning.  Denies headache.  Denies chest pain or shortness of breath.  States she is taking her antihypertensives as prescribed but cannot remember what they are.  Review of Systems  Positive: See above Negative: See above  Physical Exam  BP (!) 215/148 (BP Location: Left Arm)   Pulse (!) 111   Temp 98.4 F (36.9 C) (Oral)   Resp 18   Ht 5\' 11"  (1.803 m)   Wt 64.9 kg   SpO2 100%   BMI 19.94 kg/m  Gen:   Awake, no distress   Resp:  Normal effort  MSK:   Moves extremities without difficulty  Other:    Medical Decision Making  Medically screening exam initiated at 5:27 PM.  Appropriate orders placed.  Ashley Potts was informed that the remainder of the evaluation will be completed by another provider, this initial triage assessment does not replace that evaluation, and the importance of remaining in the ED until their evaluation is complete.  Work up started     Ashley Eagle, PA-C 02/19/24 1729

## 2024-02-19 NOTE — ED Triage Notes (Signed)
 Pt reports diarrhea, "feeling dehydrated", and HTN x1 day. Pt attempted to take prescribed bp meds and immodium with no relief.
